# Patient Record
Sex: Female | Born: 1960 | Race: Black or African American | Hispanic: No | Marital: Single | Smoking: Never smoker
Health system: Southern US, Community
[De-identification: ages and names within clinical notes are randomized; demographics above are authoritative.]

## PROBLEM LIST (undated history)

## (undated) DIAGNOSIS — G8929 Other chronic pain: Secondary | ICD-10-CM

## (undated) DIAGNOSIS — M5136 Other intervertebral disc degeneration, lumbar region: Secondary | ICD-10-CM

## (undated) DIAGNOSIS — M51369 Other intervertebral disc degeneration, lumbar region without mention of lumbar back pain or lower extremity pain: Secondary | ICD-10-CM

## (undated) DIAGNOSIS — F419 Anxiety disorder, unspecified: Secondary | ICD-10-CM

## (undated) DIAGNOSIS — M7502 Adhesive capsulitis of left shoulder: Secondary | ICD-10-CM

## (undated) DIAGNOSIS — M546 Pain in thoracic spine: Secondary | ICD-10-CM

## (undated) DIAGNOSIS — M7062 Trochanteric bursitis, left hip: Secondary | ICD-10-CM

## (undated) DIAGNOSIS — K219 Gastro-esophageal reflux disease without esophagitis: Secondary | ICD-10-CM

## (undated) DIAGNOSIS — M25512 Pain in left shoulder: Secondary | ICD-10-CM

## (undated) DIAGNOSIS — M67814 Other specified disorders of tendon, left shoulder: Secondary | ICD-10-CM

## (undated) HISTORY — PX: TOTAL SHOULDER ARTHROPLASTY: SHX126

## (undated) HISTORY — PX: CHOLECYSTECTOMY: SHX55

## (undated) HISTORY — DX: Other specified disorders of tendon, left shoulder: M67.814

## (undated) HISTORY — DX: Trochanteric bursitis, left hip: M70.62

## (undated) HISTORY — DX: Adhesive capsulitis of left shoulder: M75.02

## (undated) HISTORY — PX: OTHER SURGICAL HISTORY: SHX169

## (undated) HISTORY — PX: TUBAL LIGATION: SHX77

## (undated) HISTORY — DX: Pain in left shoulder: M25.512

## (undated) HISTORY — DX: Other intervertebral disc degeneration, lumbar region without mention of lumbar back pain or lower extremity pain: M51.369

## (undated) HISTORY — PX: COLONOSCOPY: SHX174

## (undated) HISTORY — DX: Pain in thoracic spine: M54.6

## (undated) HISTORY — DX: Other intervertebral disc degeneration, lumbar region: M51.36

## (undated) HISTORY — PX: FOOT SURGERY: SHX648

## (undated) HISTORY — DX: Other chronic pain: G89.29

## (undated) HISTORY — PX: LUMBAR SPINE SURGERY: SHX701

---

## 2003-08-02 ENCOUNTER — Other Ambulatory Visit: Admission: RE | Admit: 2003-08-02 | Discharge: 2003-08-02 | Payer: Self-pay | Admitting: Obstetrics and Gynecology

## 2004-05-24 ENCOUNTER — Ambulatory Visit (HOSPITAL_COMMUNITY): Admission: RE | Admit: 2004-05-24 | Discharge: 2004-05-24 | Payer: Self-pay | Admitting: Obstetrics and Gynecology

## 2004-05-24 ENCOUNTER — Encounter (INDEPENDENT_AMBULATORY_CARE_PROVIDER_SITE_OTHER): Payer: Self-pay | Admitting: Specialist

## 2004-05-28 ENCOUNTER — Inpatient Hospital Stay (HOSPITAL_COMMUNITY): Admission: AD | Admit: 2004-05-28 | Discharge: 2004-05-30 | Payer: Self-pay | Admitting: Obstetrics and Gynecology

## 2009-04-14 ENCOUNTER — Ambulatory Visit: Payer: Self-pay | Admitting: Obstetrics and Gynecology

## 2009-04-15 ENCOUNTER — Ambulatory Visit (HOSPITAL_COMMUNITY): Admission: RE | Admit: 2009-04-15 | Discharge: 2009-04-15 | Payer: Self-pay | Admitting: Family Medicine

## 2009-05-05 ENCOUNTER — Ambulatory Visit: Payer: Self-pay | Admitting: Obstetrics and Gynecology

## 2010-09-22 NOTE — Discharge Summary (Signed)
NAMEJEAN, ALEJOS              ACCOUNT NO.:  [id number]   MEDICAL RECORD NO.:  [id number]          PATIENT TYPE:  INP   LOCATION:  9306                          FACILITY:  [facility]   PHYSICIAN:  Janine Limbo, M.D.DATE OF BIRTH:  07/24/1960   DATE OF ADMISSION:  05/28/2004  DATE OF DISCHARGE:  05/30/2004                                 DISCHARGE SUMMARY   DISCHARGE DIAGNOSES:  1.  Status post diagnostic laparoscopy on May 24, 2004 with laparoscopic      lysis of adhesions as well as laparoscopic pelvic biopsies, hysteroscopy      with resection of endometrial polyps, dilatation and curettage.  2.  Postoperative abdominal pain.  3.  Questionable urinary tract infection.  4.  Elevated white blood cell count with a question of pelvic infection.  5.  Hypertension.   PROCEDURES THIS ADMISSION:  None.   HISTORY OF PRESENT ILLNESS:  Ms. [name] is a 50 year old female, para 4-0-0-  4, who had outpatient surgery on May 22, 2004. The patient had a  diagnostic laparoscopy with laparoscopic pelvic biopsies and laparoscopic  lysis of adhesions.  She also had hysteroscopy with a resection of an  endometrial polyp. She also had a dilatation and curettage.  The patient has  had four cesarean sections in the past. She has also had a cholecystectomy.   FINDINGS:  Uterus that sounded to 11 cm.  The patient was noted to have  polyps within the endometrial cavity but no other pathology was noted.  On  laparoscopy, the patient was found to have normal ovaries bilaterally. 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  There were dense  adhesions between the omentum and the anterior abdominal wall.  There was no  evidence of endometriosis present.  We were able to clearly see the bowel,  the appendix, and the liver. There was no evidence of pathology there.  The  patient's pathology report showing all benign  elements.  The patient called  on May 28, 2004 complaining of worsening abdominal pain.  She reports  that her pain medication did not resolve her discomfort.  Please see her  history and physical exam for more details.  The patient reports that she  has not been able to keep food down.  She reports that the pain did radiate  to her back.   ADMISSION PHYSICAL EXAM:  VITAL SIGNS:  Temperature 98.2, pulse 76,  respirations 20, blood pressure 123/68.  GENERAL:  Significant for an uncomfortable appearing patient that was  holding her abdomen.  ABDOMEN:  Her abdomen was noted to be soft but diffusely tender to  palpation.  Her incisions were well healed and clean. There was no evidence  of infection.  PELVIC:  The cervix was tender to motion and adnexal tenderness was noted on  the right.  The left adnexa was thought to be nontender.   ADMISSION LABORATORY VALUES:  A urine culture was obtained and it later grew  out 100,000 colonies of staph aureus.  GC culture was negative, chlamydia  culture was negative. Wet prep was normal except for multiple clue cells.  White blood cell count was 11,100 (58 neutrophils, 37 lymphocytes, 5 monos  and 1 eosinophils).  Hemoglobin was 11.4, platelet count was 382,000.  Chemistries were within normal limits except for a CO2 of 34, a BUN of 4,  albumin of 2.8.  Serum pregnancy test was negative.  Urine analysis showed  30 mg per deciliter of protein but otherwise normal.  There were a few  epithelial cells and few bacteria present.  The patient was admitted to the  hospital and she was started on IV Rocephin 1 g q.8 hours.  The patient  remained afebrile throughout her hospital stay.  Her IV antibiotics were  changed to cefoxitin 2 mg IV q.6 hours.  She was also given metronidazole  500 mg p.o. b.i.d.  A CT scan of the abdomen was obtained (with contrast)  and it was read as normal.  A CT scan of the pelvis with contrast was  obtained and it was also read  as normal.  Over the next two days, the  patient reports that she did begin to feel somewhat better.  On May 29, 2004, the patient's white blood cell count was noted to be 7000.  On May 30, 2004, the patient's white blood cell count was noted to be 7500.  By  May 30, 2004, the patient did tolerate a regular diet. She did have a  bowel movement.  She reported that she was still somewhat nauseated but  admitted that she did feel better.  The decision was made to discharge the  patient to home and to continue to manage her as an outpatient.  On May 30, 2004, the patient's abdomen was soft and her bowel sounds were positive.  Her incisions were well healed.   DISCHARGE MEDICATIONS:  1.  Doxicycline 100 mg b.i.d. for seven days.  2.  Septra DS 1 tablet twice each day for three days.  3.  Phenergan 25 mg tablets, 1 tablet every 6 hours as needed for nausea.  4.  Phenergan 25 mg suppository, 1 suppository q. 6h. per rectum as needed      for nausea.  5.  Dilaudid 2 mg tablets, 1 tablet every 3-4 hours as needed for pain.   DISCHARGE INSTRUCTIONS:  The patient will monitor her temperature and she  will call for a temperature of greater than 100.4.  She will call for severe  problems or questions. She will return to see Dr. Stefano Gaul in the office on  June 01, 2004 for followup examination or on an as needed basis.      AVS/MEDQ  D:  05/30/2004  T:  05/30/2004  Job:  [id number]

## 2010-09-22 NOTE — H&P (Signed)
Renee Lane, Renee Lane              ACCOUNT NO.:  [id number]   MEDICAL RECORD NO.:  [id number]          PATIENT TYPE:  AMB   LOCATION:  SDC                           FACILITY:  [facility]   PHYSICIAN:  Janine Limbo, M.D.DATE OF BIRTH:  [birth year]   DATE OF ADMISSION:  05/24/2004  DATE OF DISCHARGE:                                HISTORY & PHYSICAL   HISTORY OF PRESENT ILLNESS:  Ms. Renee Lane is a 50 year old female, para 4-0-0-  4, who presents complaining of abdominal and pelvic pain.  The patient has  had four C sections in the past.  An ultrasound was performed that showed an  ovarian cyst. A followup ultrasound did not demonstrate ovarian cyst,  however.  The patient also complains of heavy and irregular bleeding. A  hydrosonogram was performed and the patient was found to have lesions within  the endometrial cavity consistent with polyps.  One lesion measured 1.05 cm.  The other lesion measured 0.53 cm.  The ovaries appeared normal on her most  recent ultrasound.  The uterus measured 10.7 cm x 5.4 cm.  The endometrial  thickness was 0.49 cm.  The patient's most recent Pap smear was within  normal limits.  Her GC and chlamydia cultures were negative.  The patient  reports that pain medications have not been able to keep her comfortable.   OBSTETRICAL HISTORY:  The patient has had four cesarean sections at term.  She has also had a tubal ligation.   PAST MEDICAL HISTORY:  The patient has a history of anemia as well as  stomach problems.  The patient reports that she suffers from fluid retention  as well as gastroesophageal reflux disease.  In February of 2005, the  patient reports that she hurt her back and physical therapy was required.  The patient has had surgery on her foot and she has also had a  cholecystectomy done.   CURRENT MEDICATIONS:  Hydrocodone, hydrochlorothiazide 25 mg each day and  protonix.   ALLERGIES:  PERCOCET causes anxiety reactions.   SOCIAL HISTORY:  The  patient denies cigarette use, alcohol use, and  recreational drug use.   REVIEW OF SYMPTOMS:  Please see history of present illness.   FAMILY HISTORY:  The patient's mother has a history of hypertension,  diabetes and coronary artery disease. There was a question of a cervical  cancer in the past. The patient's father died from trauma. There is a  distant family history of diabetes and heart disease.   PHYSICAL EXAMINATION:  GENERAL:  Weight 230 pounds.  HEENT:  Within normal limits.  CHEST:  Clear.  HEART:  Regular rate and rhythm.  BREASTS:  Without masses.  ABDOMEN:  Nontender and there are multiple scars present.  BACK:  Nontender.  EXTREMITIES:  Grossly normal.  NEUROLOGIC:  Grossly normal.  PELVIC:  External genitalia is normal, the vagina is normal except for  relaxation. Cervix is nontender. The uterus is upper limits of normal size  and slightly tender to exam. Adnexa slightly tender to exam and rectovaginal  exam confirms.   ASSESSMENT:  1.  Irregular bleeding.  2.  Endometrial polyps.  3.  Pelvic pain.  4.  Status post four cesarean sections.  5.  Status post cholecystectomy.   PLAN:  The patient will undergo a diagnostic laparoscopy, hysterectomy and  dilatation and curettage. She understands the indications for her procedure  and she accepts the risk of, but not limited to, anesthetic complications,  bleeding, infections and possible damage to the surrounding organs. We will  use the resection apparatus through the hysteroscope if necessary. The  patient understands that no guarantees can be given concerning the total  relief of her discomfort.     Arth   AVS/MEDQ  D:  05/22/2004  T:  05/22/2004  Job:  [id number]

## 2010-09-22 NOTE — Op Note (Signed)
NAMEBRYANT, LIPPS              ACCOUNT NO.:  [id number]   MEDICAL RECORD NO.:  [id number]          PATIENT TYPE:  AMB   LOCATION:  SDC                           FACILITY:  [facility]   PHYSICIAN:  Janine Limbo, M.D.DATE OF BIRTH:  09-27-1960   DATE OF PROCEDURE:  05/24/2004  DATE OF DISCHARGE:                                 OPERATIVE REPORT   PREOPERATIVE DIAGNOSES:  1.  Irregular uterine bleeding.  2.  Endometrial polyps.  3.  Pelvic pain.  4.  Four prior cesarean sections.  5.  Mole on left abdomen.   POSTOPERATIVE DIAGNOSIS:  1.  Irregular uterine bleeding.  2.  Endometrial polyps.  3.  Pelvic pain.  4.  Four prior cesarean sections.  5.  Mole on left abdomen.  6.  Pelvic adhesions.   PROCEDURES:  1.  Diagnostic hysteroscopy.  2.  Hysteroscopic resection of endometrial polyps.  3.  Dilatation and curettage.  4.  Diagnostic laparoscopy.  5.  Laparoscopic pelvic biopsies.  6.  Laparoscopic lysis of adhesions.   SURGEON:  Janine Limbo, M.D.   FIRST ASSISTANT:  None.   ANESTHETIC:  General.   DISPOSITION:  Ms. [name] as a 50 year old female who presents with the above-  mentioned diagnoses.  The patient understands the indications for her  surgical procedure and she accepts the risk of, but not limited to,  anesthetic complications, bleeding, infection, and possible damage to  surrounding organs.  She understands that no guarantees can be given  concerning the total relief of her pelvic discomfort.   FINDINGS:  The uterus sounded to 11 cm.  Upon inspection of the uterus, the  patient was noted to have polyps within the endometrium.  There were no  other pathologic-appearing lesions.  On laparoscopy the patient was noted to  have normal ovaries bilaterally.  There was a hydatid cyst of Morgagni  present on the right fallopian tube, and it measured approximately 1.5 cm in  size.  There was likewise a hydatid cyst of Morgagni on the left fallopian  tube.   There was a dense band of adhesions between the omentum and the  anterior abdominal wall.  The anterior cul-de-sac did not appear to have any  signs of endometriosis.  There was slight scarring from the patient's prior  cesarean deliveries.  The posterior cul-de-sac was clear.  There was no  evidence of endometriosis.  The bowel, the appendix, and the liver appeared  normal.  The gallbladder appeared to be surgically absent.  There was a  pedunculated mole on the left lower abdomen that measured approximately 2 cm  in size.  It did not appear malignant.  It was covered with skin.   PROCEDURE:  The patient was taken to the operating room, where a general  anesthetic was given.  The patient's abdomen, perineum, and vagina were  prepped with multiple layers of Betadine.  Examination under anesthesia was  then performed.  A Foley catheter was placed in the bladder.  The patient  was sterilely draped.  A paracervical block was placed using 10 mL of 0.5%  Marcaine  with epinephrine.  An endocervical curettage was performed.  The  uterus was then sounded to 11 cm. The cervix was slightly dilated.  The  diagnostic hysteroscope was inserted and the pelvic cavity was inspected  with findings as mentioned above.  Pictures were taken were taken of the  patient's hysteroscopic anatomy.  We then explored the uterus using Randall  stone forceps.  The uterus was then curetted using a medium sharp curette.  The diagnostic hysteroscope was again inserted and the patient was again  noted to have polyps within the endometrial cavity.  We then dilated the  cervix further and we inserted the operative hysteroscope.  The resection  instrument was placed through the hysteroscope and the polyps were removed  using a single loop apparatus.  Hemostasis was noted to be adequate.  Again,  pictures were taken and the cavity was noted to be clean.  All instruments  were removed.  We felt that at that point we were ready  to proceed with our  laparoscopy.  A Hulka tenaculum was placed inside the uterus.  The the  patient was tilted.  The operator then changed gown and gloves.  The  subumbilical area was injected with 5 mL of 0.5% Marcaine with epinephrine.  An incision was made and carried sharply through the subcutaneous tissue,  the fascia, and the anterior peritoneum.  The Hulka tenaculum was sutured  into place.  The laparoscope was inserted after a pneumoperitoneum was  obtained.  The pelvis was carefully inspected.  The left suprapubic region  of the abdomen was then injected with 3 mL of 0.5% Marcaine with  epinephrine.  An incision was made.  A 5 mm trocar was placed in the lower  abdomen under direct visualization.  Pictures were taken of the patient's  pelvic anatomy.  We began our procedure by removing the hydatid cyst of  Morgagni on the right.  The fluid was drained from the hydatid cyst on the  left fallopian tube.  The adhesion between the omentum and the anterior  abdominal wall was then cauterized at its attachment to the anterior  abdominal wall.  Care was taken not to damage any of the vital underlying  structures.  The adhesion was then lysed using sharp dissection.  The pelvis  and abdominal contents were then carefully inspected.  There was no evidence  of endometriosis.  There were some adhesions that were removed through the  laparoscope.  There was some material present on the left anterior uterus  that was removed through the laparoscope using sharp dissection and bipolar  cautery.  At this point we felt that we were ready to end the laparoscopic  portion of our procedure.  The suprapubic trocar was removed under direct  visualization.  The Hasson cannula was then removed.  The pneumoperitoneum  was allowed to escape.  The fascia was closed using figure-of-eight sutures  of 0 Vicryl.  The subcutaneous layer at the umbilicus was closed using 0 Vicryl.  The skin was reapproximated  using a subcuticular suture of 4-0  Vicryl.  Sponge, needle, and instrument counts were correct.  The estimated  blood loss to this point was 15 mL.  The mole on the left lower abdomen was  injected with 2 mL of 0.5% Marcaine with epinephrine.  The mole was sharply  excised.  The skin was reapproximated using a subcuticular suture of 4-0  Vicryl.  All counts were noted to be correct.  The patient was returned to  a  more normal supine position.  The anesthetic was reversed without difficulty  and the patient was taken to the recovery room in stable condition.  The  estimated sorbitol deficit for hysteroscopy was approximately 80 mL.  The  patient tolerated her procedure well.  She was noted to drain clear yellow  urine at the end of our procedure.   FOLLOW-UP INSTRUCTIONS:  The patient will return to see Dr. Stefano Gaul in two  to four weeks for follow-up examination.  She was given a prescription for  Darvocet, and she will take one or two tablets every four hours as  needed for pain.  She will call for questions or concerns.  She was given a  copy of the postoperative instruction sheet as prepared by the [facility] for patients who have undergone a dilatation and  curettage and also who have undergone laparoscopy.     Arth   AVS/MEDQ  D:  05/24/2004  T:  05/24/2004  Job:  [id number]

## 2011-06-11 ENCOUNTER — Emergency Department (HOSPITAL_BASED_OUTPATIENT_CLINIC_OR_DEPARTMENT_OTHER)
Admission: EM | Admit: 2011-06-11 | Discharge: 2011-06-11 | Discharge status: Against Medical Advice | Payer: 59 | Attending: Emergency Medicine | Admitting: Emergency Medicine

## 2011-06-11 ENCOUNTER — Encounter (HOSPITAL_BASED_OUTPATIENT_CLINIC_OR_DEPARTMENT_OTHER): Payer: Self-pay | Admitting: *Deleted

## 2011-06-11 DIAGNOSIS — M79609 Pain in unspecified limb: Secondary | ICD-10-CM | POA: Insufficient documentation

## 2011-06-11 HISTORY — DX: Gastro-esophageal reflux disease without esophagitis: K21.9

## 2011-06-11 NOTE — ED Notes (Signed)
States pain to her right great toe x 3 months. Started after getting a pedicure. Has been seen for same without relief.

## 2011-06-11 NOTE — ED Notes (Signed)
Pt and visitor playing with phone, never mentioned anything to [facility] out,signed ama.

## 2011-06-11 NOTE — ED Notes (Signed)
ama discussed with charge nurse and form signed

## 2011-11-07 ENCOUNTER — Emergency Department (HOSPITAL_BASED_OUTPATIENT_CLINIC_OR_DEPARTMENT_OTHER)
Admission: EM | Admit: 2011-11-07 | Discharge: 2011-11-07 | Disposition: A | Discharge status: Home / Self Care | Payer: 59 | Attending: Emergency Medicine | Admitting: Emergency Medicine

## 2011-11-07 ENCOUNTER — Encounter (HOSPITAL_BASED_OUTPATIENT_CLINIC_OR_DEPARTMENT_OTHER): Payer: Self-pay | Admitting: *Deleted

## 2011-11-07 DIAGNOSIS — Z79899 Other long term (current) drug therapy: Secondary | ICD-10-CM | POA: Insufficient documentation

## 2011-11-07 DIAGNOSIS — M76899 Other specified enthesopathies of unspecified lower limb, excluding foot: Secondary | ICD-10-CM | POA: Insufficient documentation

## 2011-11-07 DIAGNOSIS — K219 Gastro-esophageal reflux disease without esophagitis: Secondary | ICD-10-CM | POA: Insufficient documentation

## 2011-11-07 DIAGNOSIS — M719 Bursopathy, unspecified: Secondary | ICD-10-CM

## 2011-11-07 MED ORDER — PREDNISONE 20 MG PO TABS: 40.0000 mg | ORAL_TABLET | Freq: Every day | ORAL | Status: DC

## 2011-11-07 MED ORDER — HYDROCODONE-ACETAMINOPHEN 5-500 MG PO TABS: 1.0000 | ORAL_TABLET | Freq: Four times a day (QID) | ORAL | Status: AC | PRN

## 2011-11-07 NOTE — ED Notes (Signed)
Pt c/o left hip pain radiating into left thigh since saturday

## 2011-11-07 NOTE — ED Provider Notes (Signed)
History     CSN: [id number]  Arrival date & time 11/07/11  2036   First MD Initiated Contact with Patient 11/07/11 2101      Chief Complaint  Patient presents with  . Hip Pain    (Consider location/radiation/quality/duration/timing/severity/associated sxs/prior treatment) HPI Comments: Pt states that it hurts with palpation and certain movement to the left hip:pt states that she has not had any injury:pt states that she has recently started zumba  Patient is a 51 y.o. female presenting with hip pain. The history is provided by the patient. No language interpreter was used.  Hip Pain This is a new problem. The current episode started in the past 7 days. The problem occurs constantly. The problem has been unchanged. Pertinent negatives include no fever. The symptoms are aggravated by bending and twisting. She has tried NSAIDs for the symptoms. The treatment provided mild relief.    Past Medical History  Diagnosis Date  . GERD (gastroesophageal reflux disease)     Past Surgical History  Procedure Date  . C sections   . Foot surgery   . Cholecystectomy   . Tubal ligation     No family history on file.  History  Substance Use Topics  . Smoking status: Never Smoker   . Smokeless tobacco: Never Used  . Alcohol Use: No    OB History    Grav Para Term Preterm Abortions TAB SAB Ect Mult Living                  Review of Systems  Constitutional: Negative for fever.  Respiratory: Negative.   Cardiovascular: Negative.   Musculoskeletal:       Left thigh pain    Allergies  Percocet  Home Medications   Current Outpatient Rx  Name Route Sig Dispense Refill  . VITAMIN B-12 PO Oral Take by mouth. Patient receives this injection every two weeks on Friday.    . ESOMEPRAZOLE MAGNESIUM 20 MG PO CPDR Oral Take 20 mg by mouth daily before breakfast.    . PHENTERMINE HCL 37.5 MG PO CAPS Oral Take 37.5 mg by mouth every morning.    Marland Kitchen HYDROCODONE-ACETAMINOPHEN 5-500 MG PO TABS  Oral Take 1-2 tablets by mouth every 6 (six) hours as needed for pain. 10 tablet 0  . PREDNISONE 20 MG PO TABS Oral Take 2 tablets (40 mg total) by mouth daily. 10 tablet 0    BP 99/69  Pulse 83  Temp 98.5 F (36.9 C) (Oral)  Resp 16  Wt 199 lb (90.266 kg)  SpO2 100%  LMP 10/21/2011  Physical Exam  Nursing note and vitals reviewed. Constitutional: She is oriented to person, place, and time. She appears well-developed and well-nourished.  Cardiovascular: Normal rate and regular rhythm.   Pulmonary/Chest: Effort normal and breath sounds normal.  Musculoskeletal: Normal range of motion.       Pt has full NWG:NFAOZH to the left hip:no redness or warmth noted  Neurological: She is alert and oriented to person, place, and time.  Skin: Skin is warm and dry.  Psychiatric: She has a normal mood and affect.    ED Course  Procedures (including critical care time)  Labs Reviewed - No data to display No results found.   1. Bursitis       MDM  Pt has full YQM:VHQI treat for bursitis:no sign of septic joint        Teressa Lower, NP 11/07/11 2147

## 2011-11-08 NOTE — ED Provider Notes (Signed)
Medical screening examination/treatment/procedure(s) were performed by non-physician practitioner and as supervising physician I was immediately available for consultation/collaboration.   Gid Schoffstall, MD 11/08/11 0011 

## 2013-08-03 ENCOUNTER — Encounter: Payer: Self-pay | Admitting: Obstetrics & Gynecology

## 2013-08-03 ENCOUNTER — Ambulatory Visit (INDEPENDENT_AMBULATORY_CARE_PROVIDER_SITE_OTHER): Payer: 59 | Admitting: Obstetrics & Gynecology

## 2013-08-03 VITALS — BP 126/77 | HR 75 | Temp 98.7°F | Ht 69.0 in | Wt 213.0 lb

## 2013-08-03 DIAGNOSIS — Z0000 Encounter for general adult medical examination without abnormal findings: Secondary | ICD-10-CM

## 2013-08-03 DIAGNOSIS — Z01419 Encounter for gynecological examination (general) (routine) without abnormal findings: Secondary | ICD-10-CM

## 2013-08-03 MED ORDER — NORETHINDRONE 0.35 MG PO TABS: 1.0000 | ORAL_TABLET | Freq: Every day | ORAL | Status: DC

## 2013-08-03 NOTE — Progress Notes (Signed)
Subjective:     Renee Lane is a 53 y.o. female here for a routine exam.  Current complaints: Patient is in the office today for annual exam- patient does have pain on R when her cycle start and sometimes during intercourse..  Personal health questionnaire reviewed: no.   Gynecologic History Patient's last menstrual period was 07/07/2013. Contraception: tubal ligation Last Pap: 2013. Results were: normal Last mammogram: 07/2013. Results were: not resulted yet- but have always been normal.  Obstetric History OB History  No data available     The following portions of the patient's history were reviewed and updated as appropriate: allergies, current medications, past family history, past medical history, past social history, past surgical history and problem list.  Review of Systems Pertinent items are noted in HPI.    Objective:      General appearance: alert Breasts: normal appearance, no masses or tenderness Abdomen: soft, non-tender; bowel sounds normal; no masses,  no organomegaly Pelvic: cervix normal in appearance, external genitalia normal, no adnexal masses or tenderness, uterus normal size, shape, and consistency and vagina normal without discharge       Informal U/S by me: no adnexal masses premm [employer] model lynch risk  < 5% Assessment:    Healthy female exam.    Plan:    Return prn

## 2013-08-04 LAB — HIV ANTIBODY (ROUTINE TESTING W REFLEX): HIV: NONREACTIVE

## 2013-08-04 LAB — PAP IG AND HPV HIGH-RISK: HPV DNA High Risk: NOT DETECTED

## 2013-08-04 LAB — RPR

## 2013-08-06 NOTE — Patient Instructions (Signed)

## 2014-01-04 ENCOUNTER — Encounter: Payer: Self-pay | Admitting: Obstetrics & Gynecology

## 2014-01-04 ENCOUNTER — Ambulatory Visit (INDEPENDENT_AMBULATORY_CARE_PROVIDER_SITE_OTHER): Payer: 59 | Admitting: Obstetrics & Gynecology

## 2014-01-04 VITALS — BP 118/77 | HR 69 | Temp 98.5°F | Ht 69.0 in | Wt 210.0 lb

## 2014-01-04 DIAGNOSIS — N926 Irregular menstruation, unspecified: Secondary | ICD-10-CM

## 2014-01-04 DIAGNOSIS — N939 Abnormal uterine and vaginal bleeding, unspecified: Secondary | ICD-10-CM

## 2014-01-05 ENCOUNTER — Ambulatory Visit (HOSPITAL_COMMUNITY)
Admission: RE | Admit: 2014-01-05 | Discharge: 2014-01-05 | Disposition: A | Discharge status: Home / Self Care | Payer: 59 | Source: Ambulatory Visit | Attending: Obstetrics & Gynecology | Admitting: Obstetrics & Gynecology

## 2014-01-05 DIAGNOSIS — N938 Other specified abnormal uterine and vaginal bleeding: Secondary | ICD-10-CM | POA: Insufficient documentation

## 2014-01-05 DIAGNOSIS — N925 Other specified irregular menstruation: Secondary | ICD-10-CM | POA: Insufficient documentation

## 2014-01-05 DIAGNOSIS — N949 Unspecified condition associated with female genital organs and menstrual cycle: Secondary | ICD-10-CM | POA: Insufficient documentation

## 2014-01-05 DIAGNOSIS — N939 Abnormal uterine and vaginal bleeding, unspecified: Secondary | ICD-10-CM

## 2014-01-05 NOTE — Progress Notes (Signed)
Patient ID: Renee Lane, female   DOB: 1961-04-05, 53 y.o.   MRN: [id number]  Chief Complaint  Patient presents with  . Follow-up    HPI Renee Lane is a 53 y.o. female.  She reports continued heavy menses with the minipill.  HPI  Past Medical History  Diagnosis Date  . GERD (gastroesophageal reflux disease)     Past Surgical History  Procedure Laterality Date  . C sections    . Foot surgery    . Cholecystectomy    . Tubal ligation      Family History  Problem Relation Age of Onset  . Cancer Mother   . COPD Mother   . Diabetes Mother   . Heart disease Mother     Social History History  Substance Use Topics  . Smoking status: Never Smoker   . Smokeless tobacco: Never Used  . Alcohol Use: No    Allergies  Allergen Reactions  . Percocet [Oxycodone-Acetaminophen] Rash    Current Outpatient Prescriptions  Medication Sig Dispense Refill  . aspirin 81 MG tablet Take 81 mg by mouth daily.      . Cyanocobalamin (VITAMIN B-12 PO) Take by mouth. Patient receives this injection every two weeks on Friday.      . Diethylpropion HCl CR 75 MG TB24 Take 1 tablet by mouth daily.      Marland Kitchen esomeprazole (NEXIUM) 20 MG capsule Take 20 mg by mouth daily before breakfast.      . norethindrone (MICRONOR,CAMILA,ERRIN) 0.35 MG tablet Take 1 tablet (0.35 mg total) by mouth daily.  1 Package  11   No current facility-administered medications for this visit.    Review of Systems Review of Systems Constitutional: negative for fatigue and weight loss Respiratory: negative for cough and wheezing Cardiovascular: negative for chest pain, fatigue and palpitations Gastrointestinal: negative for abdominal pain and change in bowel habits Genitourinary: positive for abnormal uterine bleeding Integument/breast: negative for nipple discharge Musculoskeletal:negative for myalgias Neurological: negative for gait problems and tremors Behavioral/Psych: negative for abusive relationship,  depression Endocrine: negative for temperature intolerance     Blood pressure 118/77, pulse 69, temperature 98.5 F (36.9 C), height  (1.753 m), weight 95.255 kg (210 lb), last menstrual period 12/05/2013.  Physical Exam Physical Exam   50% of 15 min visit spent on counseling and coordination of care.   Data Reviewed None  Assessment    AUB--?etiology, consider E    Plan    Orders Placed This Encounter  Procedures  . US Transvaginal Non-OB    Standing Status: Future     Number of Occurrences:      Standing Expiration Date: 03/07/2015    Scheduling Instructions:     Scheduled within the next 3 weeks    Order Specific Question:  Reason for Exam (SYMPTOM  OR DIAGNOSIS REQUIRED)    Answer:  Abnormal uterine bleeding    Order Specific Question:  Preferred imaging location?    Answer:  [facility]  . US Pelvis Complete    Standing Status: Future     Number of Occurrences:      Standing Expiration Date: 03/07/2015    Order Specific Question:  Reason for Exam (SYMPTOM  OR DIAGNOSIS REQUIRED)    Answer:  Abnormal bleeding    Order Specific Question:  Preferred imaging location?    Answer:  [facility]   Meds ordered this encounter  Medications  . Diethylpropion HCl CR 75 MG TB24    Sig: Take 1  tablet by mouth daily.   She elects to proceed with a D&C/hysteroscopy/Novasure endometrial ablation.  The risks, benefits and alternative forms of management were discussed. Follow up as needed.         JACKSON-MOORE,Shanah Guimaraes A 01/05/2014, 3:49 AM

## 2014-01-05 NOTE — Patient Instructions (Signed)
Endometrial Ablation Endometrial ablation removes the lining of the uterus (endometrium). It is usually a same-day, outpatient treatment. Ablation helps avoid major surgery, such as surgery to remove the cervix and uterus (hysterectomy). After endometrial ablation, you will have little or no menstrual bleeding and may not be able to have children. However, if you are premenopausal, you will need to use a reliable method of birth control following the procedure because of the small chance that pregnancy can occur. There are different reasons to have this procedure, which include:  Heavy periods.  Bleeding that is causing anemia.  Irregular bleeding.  Bleeding fibroids on the lining inside the uterus if they are smaller than 3 centimeters. This procedure should not be done if:  You want children in the future.  You have severe cramps with your menstrual period.  You have precancerous or cancerous cells in your uterus.  You were recently pregnant.  You have gone through menopause.  You have had major surgery on the uterus, such as a cesarean delivery. LET YOUR HEALTH CARE PROVIDER KNOW ABOUT:  Any allergies you have.  All medicines you are taking, including vitamins, herbs, eye drops, creams, and over-the-counter medicines.  Previous problems you or members of your family have had with the use of anesthetics.  Any blood disorders you have.  Previous surgeries you have had.  Medical conditions you have. RISKS AND COMPLICATIONS  Generally, this is a safe procedure. However, as with any procedure, complications can occur. Possible complications include:  Perforation of the uterus.  Bleeding.  Infection of the uterus, bladder, or vagina.  Injury to surrounding organs.  An air bubble to the lung (air embolus).  Pregnancy following the procedure.  Failure of the procedure to help the problem, requiring hysterectomy.  Decreased ability to diagnose cancer in the lining of  the uterus. BEFORE THE PROCEDURE  The lining of the uterus must be tested to make sure there is no pre-cancerous or cancer cells present.  An ultrasound may be performed to look at the size of the uterus and to check for abnormalities.  Medicines may be given to thin the lining of the uterus. PROCEDURE  During the procedure, your health care provider will use a tool called a resectoscope to help see inside your uterus. There are different ways to remove the lining of your uterus.   Radiofrequency - This method uses a radiofrequency-alternating electric current to remove the lining of the uterus.  Cryotherapy - This method uses extreme cold to freeze the lining of the uterus.  Heated-Free Liquid - This method uses heated salt (saline) solution to remove the lining of the uterus.  Microwave - This method uses high-energy microwaves to heat up the lining of the uterus to remove it.  Thermal balloon - This method involves inserting a catheter with a balloon tip into the uterus. The balloon tip is filled with heated fluid to remove the lining of the uterus. AFTER THE PROCEDURE  After your procedure, do not have sexual intercourse or insert anything into your vagina until permitted by your health care provider. After the procedure, you may experience:  Cramps.  Vaginal discharge.  Frequent urination. Document Released: 03/02/2004 Document Revised: 12/24/2012 Document Reviewed: 09/24/2012 ExitCare Patient Information 2015 [employer]. This information is not intended to replace advice given to you by your health care provider. Make sure you discuss any questions you have with your health care provider.  

## 2014-01-18 ENCOUNTER — Other Ambulatory Visit: Payer: Self-pay | Admitting: *Deleted

## 2014-01-26 ENCOUNTER — Encounter (HOSPITAL_COMMUNITY): Payer: Self-pay

## 2014-02-01 NOTE — Patient Instructions (Addendum)
Your procedure is scheduled on:  Friday, OCT. 2, 2015  Enter through the Main Entrance of [facility] at:  12:30 P.M.  Pick up the phone at the desk and dial [phone].  Call this number if you have problems the morning of surgery: [phone].  Remember: Do NOT eat food: AFTER MIDNIGHT Thursday, February 04, 2014 Do NOT drink clear liquids after:  After 10:00 am day of surgery Take these medicines the morning of surgery with a SIP OF WATER: OMEPRAZOLE STOP TAKING VITAMINS, DIETHYLPROPION  Do NOT wear jewelry (body piercing), metal hair clips/bobby pins, make-up, or nail polish. Do NOT wear lotions, powders, or perfumes.  You may wear deoderant. Do NOT shave for 48 hours prior to surgery. Do NOT bring valuables to the hospital. Contacts, dentures, or bridgework may not be worn into surgery.  Have a responsible adult drive you home and stay with you for 24 hours after your procedure

## 2014-02-02 ENCOUNTER — Encounter (HOSPITAL_COMMUNITY): Payer: Self-pay

## 2014-02-02 ENCOUNTER — Inpatient Hospital Stay (HOSPITAL_COMMUNITY)
Admission: RE | Admit: 2014-02-02 | Discharge: 2014-02-02 | Disposition: A | Discharge status: Home / Self Care | Payer: 59 | Source: Ambulatory Visit

## 2014-02-02 ENCOUNTER — Encounter (HOSPITAL_COMMUNITY)
Admission: RE | Admit: 2014-02-02 | Discharge: 2014-02-02 | Disposition: A | Discharge status: Home / Self Care | Payer: 59 | Source: Ambulatory Visit | Attending: Obstetrics & Gynecology | Admitting: Obstetrics & Gynecology

## 2014-02-02 DIAGNOSIS — N938 Other specified abnormal uterine and vaginal bleeding: Secondary | ICD-10-CM | POA: Diagnosis present

## 2014-02-02 DIAGNOSIS — K219 Gastro-esophageal reflux disease without esophagitis: Secondary | ICD-10-CM | POA: Diagnosis not present

## 2014-02-02 DIAGNOSIS — N882 Stricture and stenosis of cervix uteri: Secondary | ICD-10-CM | POA: Diagnosis not present

## 2014-02-02 DIAGNOSIS — M329 Systemic lupus erythematosus, unspecified: Secondary | ICD-10-CM | POA: Diagnosis not present

## 2014-02-02 LAB — CBC
HCT: 36.4 % (ref 36.0–46.0)
Hemoglobin: 11.8 g/dL — ABNORMAL LOW (ref 12.0–15.0)
MCH: 27.8 pg (ref 26.0–34.0)
MCHC: 32.4 g/dL (ref 30.0–36.0)
MCV: 85.6 fL (ref 78.0–100.0)
Platelets: 300 10*3/uL (ref 150–400)
RBC: 4.25 MIL/uL (ref 3.87–5.11)
RDW: 13.9 % (ref 11.5–15.5)
WBC: 8.8 10*3/uL (ref 4.0–10.5)

## 2014-02-02 LAB — SURGICAL PCR SCREEN
MRSA, PCR: NEGATIVE
Staphylococcus aureus: NEGATIVE

## 2014-02-02 NOTE — Patient Instructions (Addendum)
Your procedure is scheduled on:  Friday, February 05, 2014  [facility] at:  12:30pm  Pick up the phone at the desk and dial [phone].  Call this number if you have problems the morning of surgery: [phone].  Remember: Do NOT eat food: After midnight Thursday Do NOT drink clear liquids after:  After 10:00 am day of surgery Take these medicines the morning of surgery with a SIP OF WATER: Omeprazole  Do NOT wear jewelry (body piercing), metal hair clips/bobby pins, make-up, or nail polish. Do NOT wear lotions, powders, or perfumes.  You may wear deoderant. Do NOT shave for 48 hours prior to surgery. Do NOT bring valuables to the hospital. Contacts, dentures, or bridgework may not be worn into surgery.  Have a responsible adult drive you home and stay with you for 24 hours after your procedure

## 2014-02-03 ENCOUNTER — Encounter: Payer: Self-pay | Admitting: *Deleted

## 2014-02-05 ENCOUNTER — Encounter (HOSPITAL_COMMUNITY)
Admission: RE | Disposition: A | Discharge status: Home / Self Care | Payer: Self-pay | Source: Ambulatory Visit | Attending: Obstetrics & Gynecology

## 2014-02-05 ENCOUNTER — Ambulatory Visit (HOSPITAL_COMMUNITY): Payer: 59

## 2014-02-05 ENCOUNTER — Encounter (HOSPITAL_COMMUNITY): Payer: 59 | Admitting: Certified Registered Nurse Anesthetist

## 2014-02-05 ENCOUNTER — Ambulatory Visit (HOSPITAL_COMMUNITY)
Admission: RE | Admit: 2014-02-05 | Discharge: 2014-02-05 | Disposition: A | Discharge status: Home / Self Care | Payer: 59 | Source: Ambulatory Visit | Attending: Obstetrics & Gynecology | Admitting: Obstetrics & Gynecology

## 2014-02-05 ENCOUNTER — Encounter (HOSPITAL_COMMUNITY): Payer: Self-pay | Admitting: Registered Nurse

## 2014-02-05 ENCOUNTER — Ambulatory Visit (HOSPITAL_COMMUNITY): Payer: 59 | Admitting: Certified Registered Nurse Anesthetist

## 2014-02-05 DIAGNOSIS — N939 Abnormal uterine and vaginal bleeding, unspecified: Secondary | ICD-10-CM

## 2014-02-05 DIAGNOSIS — N882 Stricture and stenosis of cervix uteri: Secondary | ICD-10-CM

## 2014-02-05 DIAGNOSIS — N938 Other specified abnormal uterine and vaginal bleeding: Secondary | ICD-10-CM | POA: Diagnosis not present

## 2014-02-05 HISTORY — PX: DILITATION & CURRETTAGE/HYSTROSCOPY WITH NOVASURE ABLATION: SHX5568

## 2014-02-05 SURGERY — DILATATION & CURETTAGE/HYSTEROSCOPY WITH NOVASURE ABLATION
Anesthesia: General

## 2014-02-05 MED ORDER — DEXAMETHASONE SODIUM PHOSPHATE 4 MG/ML IJ SOLN
INTRAMUSCULAR | Status: AC
Start: 2014-02-05 — End: 2014-02-05
  Filled 2014-02-05: qty 1

## 2014-02-05 MED ORDER — LIDOCAINE HCL 1 % IJ SOLN
INTRAMUSCULAR | Status: AC
  Filled 2014-02-05: qty 20

## 2014-02-05 MED ORDER — LIDOCAINE HCL 1 % IJ SOLN
INTRAMUSCULAR | Status: DC | PRN
  Administered 2014-02-05: 10 mL

## 2014-02-05 MED ORDER — PROMETHAZINE HCL 25 MG/ML IJ SOLN
6.2500 mg | INTRAMUSCULAR | Status: AC | PRN
  Administered 2014-02-05: 12.5 mg via INTRAVENOUS
  Administered 2014-02-05: 6.25 mg via INTRAVENOUS

## 2014-02-05 MED ORDER — PROPOFOL 10 MG/ML IV BOLUS
INTRAVENOUS | Status: DC | PRN
  Administered 2014-02-05: 160 mg via INTRAVENOUS

## 2014-02-05 MED ORDER — ONDANSETRON HCL 4 MG/2ML IJ SOLN
INTRAMUSCULAR | Status: AC
Start: 2014-02-05 — End: 2014-02-05
  Filled 2014-02-05: qty 2

## 2014-02-05 MED ORDER — LIDOCAINE HCL (CARDIAC) 20 MG/ML IV SOLN
INTRAVENOUS | Status: DC | PRN
  Administered 2014-02-05: 80 mg via INTRAVENOUS

## 2014-02-05 MED ORDER — ONDANSETRON HCL 4 MG/2ML IJ SOLN
INTRAMUSCULAR | Status: AC
  Filled 2014-02-05: qty 2

## 2014-02-05 MED ORDER — PROPOFOL 10 MG/ML IV EMUL
INTRAVENOUS | Status: AC
  Filled 2014-02-05: qty 20

## 2014-02-05 MED ORDER — FENTANYL CITRATE 0.05 MG/ML IJ SOLN
INTRAMUSCULAR | Status: AC
  Filled 2014-02-05: qty 2

## 2014-02-05 MED ORDER — SCOPOLAMINE 1 MG/3DAYS TD PT72
1.0000 | MEDICATED_PATCH | Freq: Once | TRANSDERMAL | Status: DC
  Administered 2014-02-05: 1.5 mg via TRANSDERMAL

## 2014-02-05 MED ORDER — KETOROLAC TROMETHAMINE 30 MG/ML IJ SOLN
INTRAMUSCULAR | Status: DC | PRN
  Administered 2014-02-05: 30 mg via INTRAVENOUS

## 2014-02-05 MED ORDER — LACTATED RINGERS IV SOLN
INTRAVENOUS | Status: DC
  Administered 2014-02-05 (×2): via INTRAVENOUS

## 2014-02-05 MED ORDER — FENTANYL CITRATE 0.05 MG/ML IJ SOLN
INTRAMUSCULAR | Status: AC
  Administered 2014-02-05: 50 ug via INTRAVENOUS
  Filled 2014-02-05: qty 2

## 2014-02-05 MED ORDER — ACETAMINOPHEN 160 MG/5ML PO SOLN
950.0000 mg | Freq: Four times a day (QID) | ORAL | Status: DC | PRN
  Administered 2014-02-05: 950 mg via ORAL

## 2014-02-05 MED ORDER — PROMETHAZINE HCL 25 MG/ML IJ SOLN
INTRAMUSCULAR | Status: DC
Start: 2014-02-05 — End: 2014-02-05
  Filled 2014-02-05: qty 1

## 2014-02-05 MED ORDER — DEXAMETHASONE SODIUM PHOSPHATE 10 MG/ML IJ SOLN
INTRAMUSCULAR | Status: DC | PRN
  Administered 2014-02-05: 4 mg via INTRAVENOUS

## 2014-02-05 MED ORDER — ACETAMINOPHEN 160 MG/5ML PO SOLN
ORAL | Status: AC
  Filled 2014-02-05: qty 40.6

## 2014-02-05 MED ORDER — LIDOCAINE HCL (CARDIAC) 20 MG/ML IV SOLN
INTRAVENOUS | Status: AC
  Filled 2014-02-05: qty 5

## 2014-02-05 MED ORDER — TRAMADOL HCL 50 MG PO TABS: 50.0000 mg | ORAL_TABLET | Freq: Four times a day (QID) | ORAL | Status: DC | PRN

## 2014-02-05 MED ORDER — MIDAZOLAM HCL 2 MG/2ML IJ SOLN
INTRAMUSCULAR | Status: AC
Start: 2014-02-05 — End: 2014-02-05
  Filled 2014-02-05: qty 2

## 2014-02-05 MED ORDER — MIDAZOLAM HCL 2 MG/2ML IJ SOLN
INTRAMUSCULAR | Status: AC
  Filled 2014-02-05: qty 2

## 2014-02-05 MED ORDER — FENTANYL CITRATE 0.05 MG/ML IJ SOLN
INTRAMUSCULAR | Status: DC | PRN
  Administered 2014-02-05 (×2): 25 ug via INTRAVENOUS
  Administered 2014-02-05 (×2): 50 ug via INTRAVENOUS

## 2014-02-05 MED ORDER — PROPOFOL 10 MG/ML IV EMUL
INTRAVENOUS | Status: AC
Start: 2014-02-05 — End: 2014-02-05
  Filled 2014-02-05: qty 20

## 2014-02-05 MED ORDER — PROMETHAZINE HCL 25 MG/ML IJ SOLN
INTRAMUSCULAR | Status: AC
  Filled 2014-02-05: qty 1

## 2014-02-05 MED ORDER — FENTANYL CITRATE 0.05 MG/ML IJ SOLN
25.0000 ug | INTRAMUSCULAR | Status: DC | PRN
  Administered 2014-02-05 (×2): 50 ug via INTRAVENOUS

## 2014-02-05 MED ORDER — SODIUM CHLORIDE 0.9 % IJ SOLN
INTRAMUSCULAR | Status: AC
  Filled 2014-02-05: qty 3

## 2014-02-05 MED ORDER — SCOPOLAMINE 1 MG/3DAYS TD PT72
MEDICATED_PATCH | TRANSDERMAL | Status: AC
  Filled 2014-02-05: qty 1

## 2014-02-05 MED ORDER — MIDAZOLAM HCL 2 MG/2ML IJ SOLN
INTRAMUSCULAR | Status: DC | PRN
  Administered 2014-02-05: 2 mg via INTRAVENOUS

## 2014-02-05 MED ORDER — KETOROLAC TROMETHAMINE 30 MG/ML IJ SOLN
INTRAMUSCULAR | Status: AC
  Filled 2014-02-05: qty 1

## 2014-02-05 MED ORDER — ONDANSETRON HCL 4 MG/2ML IJ SOLN
INTRAMUSCULAR | Status: DC | PRN
  Administered 2014-02-05: 4 mg via INTRAVENOUS

## 2014-02-05 MED ORDER — LACTATED RINGERS IR SOLN
Status: DC | PRN
  Administered 2014-02-05: 3000 mL

## 2014-02-05 MED ORDER — DEXAMETHASONE SODIUM PHOSPHATE 10 MG/ML IJ SOLN
INTRAMUSCULAR | Status: AC
Start: 2014-02-05 — End: 2014-02-05
  Filled 2014-02-05: qty 1

## 2014-02-05 SURGICAL SUPPLY — 20 items
ABLATOR ENDOMETRIAL BIPOLAR (ABLATOR) ×5 IMPLANT
CANISTER SUCT 3000ML (MISCELLANEOUS) ×3 IMPLANT
CATH ROBINSON RED A/P 16FR (CATHETERS) ×3 IMPLANT
CLOTH BEACON ORANGE TIMEOUT ST (SAFETY) ×3 IMPLANT
CONTAINER PREFILL 10% NBF 60ML (FORM) ×6 IMPLANT
DRAPE HYSTEROSCOPY (DRAPE) ×3 IMPLANT
GAS MED CARBON DIOXIDE NOVASUR (MEDICAL GASES) ×2 IMPLANT
GLOVE BIO SURGEON STRL SZ 6.5 (GLOVE) ×2 IMPLANT
GLOVE BIO SURGEONS STRL SZ 6.5 (GLOVE) ×1
GOWN STRL REUS W/TWL LRG LVL3 (GOWN DISPOSABLE) ×6 IMPLANT
NDL SPNL 20GX3.5 QUINCKE YW (NEEDLE) IMPLANT
NEEDLE SPNL 20GX3.5 QUINCKE YW (NEEDLE) ×3 IMPLANT
PACK VAGINAL MINOR WOMEN LF (CUSTOM PROCEDURE TRAY) ×3 IMPLANT
PAD OB MATERNITY 4.3X12.25 (PERSONAL CARE ITEMS) ×3 IMPLANT
SCRUB PCMX 4 OZ (MISCELLANEOUS) ×3 IMPLANT
SET GENESYS HTA PROCERVA (MISCELLANEOUS) ×2 IMPLANT
SET TUBING HYSTEROSCOPY 2 NDL (TUBING) ×2 IMPLANT
TOWEL OR 17X24 6PK STRL BLUE (TOWEL DISPOSABLE) ×6 IMPLANT
TUBE HYSTEROSCOPY W Y-CONNECT (TUBING) ×2 IMPLANT
WATER STERILE IRR 1000ML POUR (IV SOLUTION) ×3 IMPLANT

## 2014-02-05 NOTE — Progress Notes (Signed)
[facility]

## 2014-02-05 NOTE — Transfer of Care (Signed)
Immediate Anesthesia Transfer of Care Note  Patient: Renee Lane  Procedure(s) Performed: Procedure(s): DILATATION & CURETTAGE/HYSTEROSCOPY WITH NOVASURE ABLATION (N/A)  Patient Location: PACU  Anesthesia Type:General  Level of Consciousness: awake  Airway & Oxygen Therapy: Patient Spontanous Breathing  Post-op Assessment: Report given to PACU RN  Post vital signs: stable  Filed Vitals:   02/05/14 1226  BP: 126/98  Pulse: 63  Temp: 36.8 C  Resp: 18    Complications: No apparent anesthesia complications

## 2014-02-05 NOTE — Op Note (Signed)
Preoperative diagnosis: Abnormal uterine bleeding  Postoperative diagnosis: Same  Procedure: Diagnostic hysteroscopy, dilatation and curettage, attempted endometrial ablation with ultrasound guidance  Surgeon: Antionette CharJACKSON-MOORE,Victorine Mcnee A  Anesthesia: Laryngeal mask airway, paracervical block  Estimated blood loss: less than 100 ml  Urine output: 200 ml  IV Fluids: per Anesthesiology  Complications: None  Specimen: PATHOLOGY  Operative Findings: The endometrium appeared lush.  The tubal ostia were not seen.  The internal cervical os was stenotic.  Description of procedure:   The patient was taken to the operating room and placed on the operating table in the semi-lithotomy position in [place] stirrups.  Examination under anesthesia was performed.  The patient was prepped and draped in the usual manner.  After a time-out had been completed, a speculum was placed in the vagina.  The anterior lip of the cervix was grasped with a single-toothed tenaculum.    10 cc of 1% lidocaine were injected at 4 and 7 o'clock to produce a paracervical block.  The uterine cavity sounded to 9 cm.  A Hegar dilator was inserted to measure the cervical length at 3.5 cm.  The endocervical canal was dilated with Shawnie PonsPratt dilators.  A 5 mm diagnostic hysteroscope with Glycine as the distending medium was used to perform a diagnostic hysteroscopy.  The findings are noted above.    The hysteroscope was removed.  The remainder of the procedure was performed with ultrasound guidance.  The bladder was filled in a retrograde fashion to optimize visualization.  A small, Sims curette was used to perform an endometrial curettage. The Novasure device was advanced to the fundus however the intercornual measurement was borderline at 2.5 cm.  The decision was made to abort this procedure.  Attempts were made to insert the HTA device/hysteroscope.  However the internal os could not be dilated to accommodate the device.  All the instruments were  removed from the vagina.  The bladder was emptied.  Final instrument counts were correct.  The patient was taken to the PACU in stable condition.

## 2014-02-05 NOTE — H&P (Signed)
  Chief Complaint: 53 y.o.who presents for Lane D&C/hysteroscopy/Novasure endometrial ablation  Details of Present Illness: The patient has recently noted heavier menses.  This has been refractory to attempts to medically manage with Lane minipill.  Lane pelvic U/S was normal.  There were no vitals taken for this visit.  Past Medical History  Diagnosis Date  . GERD (gastroesophageal reflux disease)   . Lupus    History   Social History  . Marital Status: Divorced    Spouse Name: N/Lane    Number of Children: N/Lane  . Years of Education: N/Lane   Occupational History  . Not on file.   Social History Main Topics  . Smoking status: Never Smoker   . Smokeless tobacco: Never Used  . Alcohol Use: No  . Drug Use: No  . Sexual Activity: Yes    Birth Control/ Protection: Surgical   Other Topics Concern  . Not on file   Social History Narrative  . No narrative on file   Family History  Problem Relation Age of Onset  . Cancer Mother   . COPD Mother   . Diabetes Mother   . Heart disease Mother     Pertinent items are noted in HPI.  Pre-Op Diagnosis: Abnormal Uterine Bleeding, perimenopausal  Planned Procedure: Procedure(s): DILATATION & CURETTAGE/HYSTEROSCOPY WITH NOVASURE ABLATION  I have reviewed the patient's history and have completed the physical exam and Renee Lane is acceptable for surgery. Alternative management options including Lane Mirena IUD was reviewed with the patient. Renee Lane,Renee Baumgarten A, MD 02/05/2014 9:29 AM

## 2014-02-05 NOTE — Anesthesia Postprocedure Evaluation (Signed)
  Anesthesia Post-op Note  Patient: Renee Lane  Procedure(s) Performed: Procedure(s): DILATATION & CURETTAGE/HYSTEROSCOPY WITH NOVASURE ABLATION (N/A)  Patient Location: PACU  Anesthesia Type:General  Level of Consciousness: awake, alert  and oriented  Airway and Oxygen Therapy: Patient Spontanous Breathing  Post-op Pain: none  Post-op Assessment: Post-op Vital signs reviewed, Patient's Cardiovascular Status Stable, Respiratory Function Stable, Patent Airway, No signs of Nausea or vomiting and Pain level controlled  Post-op Vital Signs: Reviewed and stable  Last Vitals:  Filed Vitals:   02/05/14 1815  BP: 116/49  Pulse: 65  Temp: 36.8 C  Resp: 11    Complications: No apparent anesthesia complications

## 2014-02-05 NOTE — Anesthesia Preprocedure Evaluation (Signed)
Anesthesia Evaluation  Patient identified by MRN, date of birth, ID band Patient awake    Reviewed: Allergy & Precautions, H&P , Patient's Chart, lab work & pertinent test results, reviewed documented beta blocker date and time   Airway Mallampati: II TM Distance: >3 FB Neck ROM: full    Dental no notable dental hx.    Pulmonary  breath sounds clear to auscultation  Pulmonary exam normal       Cardiovascular Rhythm:regular Rate:Normal     Neuro/Psych    GI/Hepatic GERD-  Medicated,  Endo/Other    Renal/GU      Musculoskeletal   Abdominal   Peds  Hematology   Anesthesia Other Findings GERD (gastroesophageal reflux disease)      Lupus     No meds. Few SX      Reproductive/Obstetrics                           Anesthesia Physical Anesthesia Plan  ASA: II  Anesthesia Plan:    Post-op Pain Management:    Induction: Intravenous  Airway Management Planned: LMA  Additional Equipment:   Intra-op Plan:   Post-operative Plan:   Informed Consent: I have reviewed the patients History and Physical, chart, labs and discussed the procedure including the risks, benefits and alternatives for the proposed anesthesia with the patient or authorized representative who has indicated his/her understanding and acceptance.   Dental Advisory Given and Dental advisory given  Plan Discussed with: CRNA and Surgeon  Anesthesia Plan Comments: (Discussed GA with LMA, possible sore throat, potential need to switch to ETT, N/V, pulmonary aspiration. Questions answered. )        Anesthesia Quick Evaluation

## 2014-02-05 NOTE — Discharge Instructions (Signed)
D&C Care After Read the instructions below. Refer to this sheet in the next few weeks. These instructions provide you with general information on caring for yourself after you leave the hospital. Your caregiver may also give you specific instructions.  D&C is a minor operation. A D&C involves the stretching (dilatation) of the cervix and scraping (curettage) of the inside lining of the uterus. You may have light cramping and bleeding for a couple of days to two weeks after the procedure. This procedure may be done in a hospital, outpatient clinic, or doctor's office. You may be given a drug to make you sleep (general anesthetic) or a drug that numbs the area (local anesthetic) in and around the cervix. HOME CARE INSTRUCTIONS  Do not drive for 24 hours.   Wait one week before returning to strenuous activities.   Take your temperature two times a day for 4 days and write it down. Provide these temperatures to your caregiver if they are abnormal (above 98.6 F or 37.0 C).   Avoid long periods of standing, and do no heavy lifting (more than 10 pounds), pushing or pulling.   Limit stair climbing to once or twice a day.   Take rest periods often.   You may resume your usual diet.   Drink plenty of fluids (6-8 glasses a day).   You should return to your usual bowel function. If constipation should occur, you may:   Take a mild laxative with permission from your caregiver.   Add fruit and bran to your diet.   Drink more fluids. This helps with constipation.   Take showers instead of baths until your caregiver gives you permission to take baths.   Do not go swimming or use a hot tub until your caregiver gives you permission.   Try to have someone with you or available for you the first 24 to 48 hours, especially if you had a general anesthetic.   Do not douche, use tampons, or have intercourse until after your follow-up appointment, or when your caregiver approves.   Only take  over-the-counter or prescription medicines for pain, discomfort, or fever as directed by your caregiver. Do not take aspirin. It can cause bleeding.   If a prescription was given, follow your caregiver's directions. You may be given a medicine that kills germs (antibiotic) to prevent an infection.   Keep all your follow-up appointments recommended by your caregiver.  SEEK MEDICAL CARE IF:  You have increasing cramps or pain not relieved with medication.   You develop belly (abdominal) pain which does not seem to be related to the same area of earlier cramping and pain.   You feel dizzy or feel like fainting.   You have bad smelling vaginal discharge.   You develop a rash.   You develop a reaction or allergy to your medication.  SEEK IMMEDIATE MEDICAL CARE IF:  Bleeding is heavier than a normal menstrual period.   You have an oral temperature above 100.6, not controlled by medicine.   You develop chest pain.   You develop shortness of breath.   You pass out.   You develop pain in your shoulder strap area.   You develop heavy vaginal bleeding with or without blood clots.  MAKE SURE YOU:   Understand these instructions.   Will watch your condition.   Will get help right away if you are not doing well or get worse.  UPDATED HEALTH PRACTICES  A PAP smear is done to screen for cervical  cancer.   The first PAP smear should be done at age 52.   Between ages 6021 and 429, PAP smears are repeated every 2 years.   Beginning at age 430, you are advised to have a PAP smear every 3 years as long as your past 3 PAP smears have been normal.   Some women have medical problems that increase the chance of getting cervical cancer. Talk to your caregiver about these problems. It is especially important to talk to your caregiver if a new problem develops soon after your last PAP smear. In these cases, your caregiver may recommend more frequent screening and Pap smears.   The above  recommendations are the same for women who have or have not gotten the vaccine for HPV (Human Papillomavirus).   If you had a hysterectomy for a problem that was not a cancer or a condition that could lead to cancer, then you no longer need Pap smears.   If you are between ages 2965 and 7970, and you have had normal Pap smears going back 10 years, you no longer need Pap smears.   If you have had past treatment for cervical cancer or a condition that could lead to cancer, you need Pap smears and screening for cancer for at least 20 years after your treatment.   Continue monthly self-breast examinations. Your caregiver can provide information and instructions for self-breast examination.  Document Released: 04/20/2000 Document Re-Released: 10/11/2009 [facility] Patient Information 2011 [employer], [place].

## 2014-02-08 ENCOUNTER — Encounter (HOSPITAL_COMMUNITY): Payer: Self-pay | Admitting: Obstetrics & Gynecology

## 2014-02-08 ENCOUNTER — Telehealth: Payer: Self-pay | Admitting: *Deleted

## 2014-02-08 NOTE — Telephone Encounter (Signed)
Patient states she had a surgery on Friday and states she had some increased pain. Patient states she has been using the heating pad and pain medication. Patient states it is helping. Still having some lower back pain and lower abdominal cramping. Eased with rest. Encouraged patient to continue comfort measures and to try Ibuprofen.

## 2014-02-17 ENCOUNTER — Telehealth: Payer: Self-pay | Admitting: Obstetrics & Gynecology

## 2014-02-17 NOTE — Telephone Encounter (Signed)
10.14.2015 - Patient called and scheduled appt for 10.26.2015.brm

## 2014-03-01 ENCOUNTER — Ambulatory Visit (INDEPENDENT_AMBULATORY_CARE_PROVIDER_SITE_OTHER): Payer: 59 | Admitting: Obstetrics & Gynecology

## 2014-03-01 ENCOUNTER — Encounter: Payer: Self-pay | Admitting: Obstetrics & Gynecology

## 2014-03-01 ENCOUNTER — Encounter: Payer: 59 | Admitting: Obstetrics & Gynecology

## 2014-03-01 VITALS — BP 120/83 | HR 74 | Temp 98.7°F | Ht 67.5 in | Wt 212.0 lb

## 2014-03-01 DIAGNOSIS — N882 Stricture and stenosis of cervix uteri: Secondary | ICD-10-CM

## 2014-03-01 MED ORDER — MISOPROSTOL 200 MCG PO TABS: ORAL_TABLET | ORAL | Status: DC

## 2014-03-02 NOTE — Patient Instructions (Signed)
Levonorgestrel intrauterine device (IUD) What is this medicine? LEVONORGESTREL IUD (LEE voe nor jes trel) is a contraceptive (birth control) device. The device is placed inside the uterus by a healthcare professional. It is used to prevent pregnancy and can also be used to treat heavy bleeding that occurs during your period. Depending on the device, it can be used for 3 to 5 years. This medicine may be used for other purposes; ask your health care provider or pharmacist if you have questions. COMMON BRAND NAME(S): LILETTA, Mirena, Skyla What should I tell my health care provider before I take this medicine? They need to know if you have any of these conditions: -abnormal Pap smear -cancer of the breast, uterus, or cervix -diabetes -endometritis -genital or pelvic infection now or in the past -have more than one sexual partner or your partner has more than one partner -heart disease -history of an ectopic or tubal pregnancy -immune system problems -IUD in place -liver disease or tumor -problems with blood clots or take blood-thinners -use intravenous drugs -uterus of unusual shape -vaginal bleeding that has not been explained -an unusual or allergic reaction to levonorgestrel, other hormones, silicone, or polyethylene, medicines, foods, dyes, or preservatives -pregnant or trying to get pregnant -breast-feeding How should I use this medicine? This device is placed inside the uterus by a health care professional. Talk to your pediatrician regarding the use of this medicine in children. Special care may be needed. Overdosage: If you think you have taken too much of this medicine contact a poison control center or emergency room at once. NOTE: This medicine is only for you. Do not share this medicine with others. What if I miss a dose? This does not apply. What may interact with this medicine? Do not take this medicine with any of the following  medications: -amprenavir -bosentan -fosamprenavir This medicine may also interact with the following medications: -aprepitant -barbiturate medicines for inducing sleep or treating seizures -bexarotene -griseofulvin -medicines to treat seizures like carbamazepine, ethotoin, felbamate, oxcarbazepine, phenytoin, topiramate -modafinil -pioglitazone -rifabutin -rifampin -rifapentine -some medicines to treat HIV infection like atazanavir, indinavir, lopinavir, nelfinavir, tipranavir, ritonavir -St. John's wort -warfarin This list may not describe all possible interactions. Give your health care provider a list of all the medicines, herbs, non-prescription drugs, or dietary supplements you use. Also tell them if you smoke, drink alcohol, or use illegal drugs. Some items may interact with your medicine. What should I watch for while using this medicine? Visit your doctor or health care professional for regular check ups. See your doctor if you or your partner has sexual contact with others, becomes HIV positive, or gets a sexual transmitted disease. This product does not protect you against HIV infection (AIDS) or other sexually transmitted diseases. You can check the placement of the IUD yourself by reaching up to the top of your vagina with clean fingers to feel the threads. Do not pull on the threads. It is a good habit to check placement after each menstrual period. Call your doctor right away if you feel more of the IUD than just the threads or if you cannot feel the threads at all. The IUD may come out by itself. You may become pregnant if the device comes out. If you notice that the IUD has come out use a backup birth control method like condoms and call your health care provider. Using tampons will not change the position of the IUD and are okay to use during your period. What side effects may   I notice from receiving this medicine? Side effects that you should report to your doctor or  health care professional as soon as possible: -allergic reactions like skin rash, itching or hives, swelling of the face, lips, or tongue -fever, flu-like symptoms -genital sores -high blood pressure -no menstrual period for 6 weeks during use -pain, swelling, warmth in the leg -pelvic pain or tenderness -severe or sudden headache -signs of pregnancy -stomach cramping -sudden shortness of breath -trouble with balance, talking, or walking -unusual vaginal bleeding, discharge -yellowing of the eyes or skin Side effects that usually do not require medical attention (report to your doctor or health care professional if they continue or are bothersome): -acne -breast pain -change in sex drive or performance -changes in weight -cramping, dizziness, or faintness while the device is being inserted -headache -irregular menstrual bleeding within first 3 to 6 months of use -nausea This list may not describe all possible side effects. Call your doctor for medical advice about side effects. You may report side effects to FDA at 1-800-FDA-1088. Where should I keep my medicine? This does not apply. NOTE: This sheet is a summary. It may not cover all possible information. If you have questions about this medicine, talk to your doctor, pharmacist, or health care provider.  2015, Elsevier/Gold Standard. (2011-05-24 13:54:04)  

## 2014-03-02 NOTE — Progress Notes (Signed)
Subjective:     Renee Lane is a 53 y.o. female who presents to the clinic 4 weeks status post D&C/hysteroscopy/attempted endometrial ablation for abnormal uterine bleeding. Eating a regular diet without difficulty. Bowel movements are normal. The patient is not having any pain.  The following portions of the patient's history were reviewed and updated as appropriate: allergies, current medications, past family history, past medical history, past social history, past surgical history and problem list.  Review of Systems Pertinent items are noted in HPI.    Objective:    BP 120/83  Pulse 74  Temp(Src) 98.7 F (37.1 C)  Ht 5' 7.5" (1.715 m)  Wt 96.163 kg (212 lb)  BMI 32.69 kg/m2  LMP 01/09/2014 General:  alert  Abdomen: soft, bowel sounds active, non-tender       Assessment:    Doing well postoperatively. Operative findings again reviewed. Pathology report/management options discussed.    Plan:   Continue any current medications.  Follow up for a Mirena IUD insertion

## 2014-03-08 ENCOUNTER — Encounter: Payer: Self-pay | Admitting: Obstetrics & Gynecology

## 2014-03-08 ENCOUNTER — Ambulatory Visit (INDEPENDENT_AMBULATORY_CARE_PROVIDER_SITE_OTHER): Payer: 59 | Admitting: Obstetrics & Gynecology

## 2014-03-08 VITALS — BP 122/81 | HR 72 | Temp 98.2°F | Ht 67.5 in | Wt 216.0 lb

## 2014-03-08 DIAGNOSIS — N939 Abnormal uterine and vaginal bleeding, unspecified: Secondary | ICD-10-CM

## 2014-03-10 DIAGNOSIS — N939 Abnormal uterine and vaginal bleeding, unspecified: Secondary | ICD-10-CM | POA: Insufficient documentation

## 2014-03-10 NOTE — Progress Notes (Signed)
IUD Insertion Procedure Note  Pre-operative Diagnosis: Abnormal uterine bleeding  Post-operative Diagnosis: Same  Indications: See above  Procedure Details    The risks (including infection, bleeding, pain, and uterine perforation) and benefits of the procedure were explained to the patient and Written informed consent was obtained.    Cervix cleansed with Betadine. Uterus sounded to 7 cm. IUD inserted without difficulty. String visible and trimmed. Patient tolerated procedure well.  IUD Information: Mirena.  Condition: Stable  Complications: None  Plan:  The patient was advised to call for any fever or for prolonged or severe pain or bleeding. She was advised to use OTC analgesics as needed for mild to moderate pain.

## 2014-03-10 NOTE — Patient Instructions (Signed)
IUD PLACEMENT POST-PROCEDURE INSTRUCTIONS  1. You may take Ibuprofen, Aleve or Tylenol for pain if needed.  Cramping should resolve within in 24 hours.  2. You may have a small amount of spotting.  You should wear a mini pad for the next few days.  3. You may have intercourse after 24 hours.  If you using this for birth control, it is effective immediately.  4. You need to call if you have any pelvic pain, fever, heavy bleeding or foul smelling vaginal discharge.  Irregular bleeding is common the first several months after having an IUD placed. You do not need to call for this reason unless you are concerned.  5. Shower or bathe as normal   

## 2014-04-14 ENCOUNTER — Encounter: Payer: Self-pay | Admitting: *Deleted

## 2014-05-03 ENCOUNTER — Encounter: Payer: Self-pay | Admitting: *Deleted

## 2014-05-04 ENCOUNTER — Encounter: Payer: Self-pay | Admitting: Obstetrics & Gynecology

## 2014-05-12 ENCOUNTER — Ambulatory Visit: Payer: 59 | Admitting: Obstetrics & Gynecology

## 2014-05-21 ENCOUNTER — Telehealth: Payer: Self-pay | Admitting: *Deleted

## 2014-05-21 NOTE — Telephone Encounter (Signed)
Patient contacted the office and has been scheduled for an appointemtn on 05-26-14 for an IUD check.

## 2014-05-21 NOTE — Telephone Encounter (Signed)
Patient called the office stating she currently has a Mirena IUD and is unable to feel the strings and is also having some pain. Patient is requesting an appointment.  Attempted to contact the patient and left message for her to contact the office.

## 2014-05-26 ENCOUNTER — Ambulatory Visit: Payer: Self-pay | Admitting: Obstetrics

## 2014-05-27 ENCOUNTER — Encounter: Payer: Self-pay | Admitting: Obstetrics

## 2014-05-27 ENCOUNTER — Ambulatory Visit (INDEPENDENT_AMBULATORY_CARE_PROVIDER_SITE_OTHER): Payer: 59 | Admitting: Obstetrics

## 2014-05-27 VITALS — BP 115/75 | HR 67 | Temp 98.6°F | Ht 67.5 in | Wt 226.0 lb

## 2014-05-27 DIAGNOSIS — R102 Pelvic and perineal pain: Secondary | ICD-10-CM

## 2014-05-27 DIAGNOSIS — Z30431 Encounter for routine checking of intrauterine contraceptive device: Secondary | ICD-10-CM

## 2014-05-28 ENCOUNTER — Encounter: Payer: Self-pay | Admitting: Obstetrics

## 2014-05-28 NOTE — Progress Notes (Signed)
Patient ID: Renee Lane, female   DOB: 09-Apr-1961, 54 y.o.   MRN: [id number]  Chief Complaint  Patient presents with  . Problem    Right lower abdominal pain    HPI Renee Lane is a 54 y.o. female.  Presents with pelvic pain since IUD insertion.  HPI  Past Medical History  Diagnosis Date  . GERD (gastroesophageal reflux disease)   . Lupus     Past Surgical History  Procedure Laterality Date  . C sections    . Foot surgery    . Cholecystectomy    . Tubal ligation    . Colonoscopy    . Dilitation & currettage/hystroscopy with novasure ablation N/A 02/05/2014    Procedure: DILATATION & CURETTAGE/HYSTEROSCOPY WITH NOVASURE ABLATION;  Surgeon: Antionette CharLisa Jackson-Moore, MD;  Location: [facility];  Service: Gynecology;  Laterality: N/A;    Family History  Problem Relation Age of Onset  . Cancer Mother   . COPD Mother   . Diabetes Mother   . Heart disease Mother     Social History History  Substance Use Topics  . Smoking status: Never Smoker   . Smokeless tobacco: Never Used  . Alcohol Use: No    Allergies  Allergen Reactions  . Percocet [Oxycodone-Acetaminophen] Rash    Current Outpatient Prescriptions  Medication Sig Dispense Refill  . aspirin 81 MG tablet Take 81 mg by mouth daily.    . Cyanocobalamin (VITAMIN B 12 PO) Take 1 tablet by mouth daily.    Marland Kitchen. loratadine (CLARITIN) 10 MG tablet Take 10 mg by mouth daily as needed for allergies.    . misoprostol (CYTOTEC) 200 MCG tablet Place 200mcg per vagina 4 hours before procedure. 1 tablet 0   No current facility-administered medications for this visit.    Review of Systems Review of Systems Constitutional: negative for fatigue and weight loss Respiratory: negative for cough and wheezing Cardiovascular: negative for chest pain, fatigue and palpitations Gastrointestinal: negative for abdominal pain and change in bowel habits Genitourinary: pelvic pain Integument/breast: negative for nipple  discharge Musculoskeletal:negative for myalgias Neurological: negative for gait problems and tremors Behavioral/Psych: negative for abusive relationship, depression Endocrine: negative for temperature intolerance     Blood pressure 115/75, pulse 67, temperature 98.6 F (37 C), height 5' 7.5" (1.715 m), weight 226 lb (102.513 kg).  Physical Exam Physical Exam General:   alert  Skin:   no rash or abnormalities  Lungs:   clear to auscultation bilaterally  Heart:   regular rate and rhythm, S1, S2 normal, no murmur, click, rub or gallop  Breasts:   normal without suspicious masses, skin or nipple changes or axillary nodes  Abdomen:  normal findings: no organomegaly, soft, non-tender and no hernia  Pelvis:  External genitalia: normal general appearance Urinary system: urethral meatus normal and bladder without fullness, nontender Vaginal: normal without tenderness, induration or masses Cervix: normal appearance.  IUD string not visible. Adnexa: normal bimanual exam Uterus: anteverted and non-tender, normal size      Data Reviewed Labs   Assessment    Pelvic pain after IUD insertion    Plan  Ultrasound ordered for IUD Placement. F/U 2 weeks.    Orders Placed This Encounter  Procedures  . US Pelvis Complete    Standing Status: Future     Number of Occurrences:      Standing Expiration Date: 07/26/2015    Order Specific Question:  Reason for Exam (SYMPTOM  OR DIAGNOSIS REQUIRED)    Answer:  Pelvic  pain.  IUD Placement.    Order Specific Question:  Preferred imaging location?    Answer:  [facility]  . US Transvaginal Non-OB    Standing Status: Future     Number of Occurrences:      Standing Expiration Date: 07/26/2015    Order Specific Question:  Reason for Exam (SYMPTOM  OR DIAGNOSIS REQUIRED)    Answer:  Pelvic pain.  IUD placement.    Order Specific Question:  Preferred imaging location?    Answer:  [facility]   No orders of the defined types were placed  in this encounter.

## 2014-06-16 ENCOUNTER — Other Ambulatory Visit: Payer: Self-pay | Admitting: Obstetrics

## 2014-06-16 ENCOUNTER — Ambulatory Visit (INDEPENDENT_AMBULATORY_CARE_PROVIDER_SITE_OTHER): Payer: 59

## 2014-06-16 DIAGNOSIS — R1031 Right lower quadrant pain: Secondary | ICD-10-CM

## 2014-06-16 DIAGNOSIS — Z30431 Encounter for routine checking of intrauterine contraceptive device: Secondary | ICD-10-CM

## 2014-06-16 DIAGNOSIS — R102 Pelvic and perineal pain: Secondary | ICD-10-CM

## 2015-05-13 ENCOUNTER — Other Ambulatory Visit (HOSPITAL_COMMUNITY): Payer: Self-pay | Admitting: Obstetrics & Gynecology

## 2015-05-13 DIAGNOSIS — R102 Pelvic and perineal pain: Secondary | ICD-10-CM

## 2015-05-13 DIAGNOSIS — N939 Abnormal uterine and vaginal bleeding, unspecified: Secondary | ICD-10-CM

## 2015-05-13 DIAGNOSIS — Z01419 Encounter for gynecological examination (general) (routine) without abnormal findings: Secondary | ICD-10-CM

## 2015-05-23 ENCOUNTER — Ambulatory Visit (HOSPITAL_COMMUNITY)
Admission: RE | Admit: 2015-05-23 | Discharge: 2015-05-23 | Disposition: A | Discharge status: Home / Self Care | Payer: 59 | Source: Ambulatory Visit | Attending: Obstetrics & Gynecology | Admitting: Obstetrics & Gynecology

## 2015-05-23 DIAGNOSIS — Z01419 Encounter for gynecological examination (general) (routine) without abnormal findings: Secondary | ICD-10-CM

## 2015-05-23 DIAGNOSIS — N939 Abnormal uterine and vaginal bleeding, unspecified: Secondary | ICD-10-CM | POA: Diagnosis not present

## 2015-05-23 DIAGNOSIS — R102 Pelvic and perineal pain: Secondary | ICD-10-CM

## 2015-07-22 ENCOUNTER — Emergency Department
Admission: EM | Admit: 2015-07-22 | Discharge: 2015-07-22 | Disposition: A | Discharge status: Home / Self Care | Payer: 59 | Source: Home / Self Care | Attending: Family Medicine | Admitting: Family Medicine

## 2015-07-22 ENCOUNTER — Encounter: Payer: Self-pay | Admitting: Emergency Medicine

## 2015-07-22 ENCOUNTER — Emergency Department (INDEPENDENT_AMBULATORY_CARE_PROVIDER_SITE_OTHER): Payer: 59

## 2015-07-22 DIAGNOSIS — M79604 Pain in right leg: Secondary | ICD-10-CM

## 2015-07-22 DIAGNOSIS — M545 Low back pain, unspecified: Secondary | ICD-10-CM

## 2015-07-22 DIAGNOSIS — M25551 Pain in right hip: Secondary | ICD-10-CM | POA: Diagnosis not present

## 2015-07-22 LAB — POCT URINALYSIS DIP (MANUAL ENTRY)
Bilirubin, UA: NEGATIVE
Glucose, UA: NEGATIVE
Ketones, POC UA: NEGATIVE
Leukocytes, UA: NEGATIVE
Nitrite, UA: NEGATIVE
Protein Ur, POC: NEGATIVE
Spec Grav, UA: <=1.005 (ref 1.005–1.03)
Urobilinogen, UA: 0.2 (ref 0–1)
pH, UA: 5 (ref 5–8)

## 2015-07-22 MED ORDER — PREDNISONE 20 MG PO TABS: ORAL_TABLET | ORAL | Status: DC

## 2015-07-22 MED ORDER — CYCLOBENZAPRINE HCL 10 MG PO TABS: ORAL_TABLET | ORAL | Status: DC

## 2015-07-22 NOTE — Discharge Instructions (Signed)
Apply ice pack for 20 to 30 minutes, 3 to 4 times daily  Continue until pain decreases.  °

## 2015-07-22 NOTE — ED Notes (Signed)
Right mid-back pain radiates into rt buttock, hip x 4 days

## 2015-07-22 NOTE — ED Provider Notes (Signed)
CSN: [id number]     Arrival date & time 07/22/15  1531 History   First MD Initiated Contact with Patient 07/22/15 1609     Chief Complaint  Patient presents with  . Back Pain      HPI Comments: Patient complains of 4 day history of pain in her right lower mid-back that radiates to her right hip and buttock.  She recalls no injury.  No urinary symptoms.   She denies bowel or bladder dysfunction, and no saddle numbness.    Patient is a 55 y.o. female presenting with back pain. The history is provided by the patient.  Back Pain Location:  Lumbar spine and gluteal region Quality:  Aching and shooting Radiates to: right buttock and hip. Pain severity:  Moderate Pain is:  Same all the time Onset quality:  Sudden Duration:  4 days Progression:  Unchanged Chronicity:  New Context: not recent injury   Relieved by:  Nothing Worsened by:  Movement, standing and lying down Ineffective treatments:  None tried Associated symptoms: leg pain   Associated symptoms: no abdominal pain, no bladder incontinence, no bowel incontinence, no dysuria, no numbness, no paresthesias, no pelvic pain, no perianal numbness, no tingling, no weakness and no weight loss   Risk factors: lack of exercise     Past Medical History  Diagnosis Date  . GERD (gastroesophageal reflux disease)   . Lupus [facility](HCC)    Past Surgical History  Procedure Laterality Date  . C sections    . Foot surgery    . Cholecystectomy    . Tubal ligation    . Colonoscopy    . Dilitation & currettage/hystroscopy with novasure ablation N/A 02/05/2014    Procedure: DILATATION & CURETTAGE/HYSTEROSCOPY WITH NOVASURE ABLATION;  Surgeon: Antionette CharLisa Jackson-Moore, MD;  Location: [facility];  Service: Gynecology;  Laterality: N/A;   Family History  Problem Relation Age of Onset  . Cancer Mother   . COPD Mother   . Diabetes Mother   . Heart disease Mother    Social History  Substance Use Topics  . Smoking status: Never Smoker   . Smokeless tobacco:  Never Used  . Alcohol Use: No   OB History    Gravida Para Term Preterm AB TAB SAB Ectopic Multiple Living   4 4 4  0 0 0 0 0 0 4     Review of Systems  Constitutional: Negative for weight loss.  Gastrointestinal: Negative for abdominal pain and bowel incontinence.  Genitourinary: Negative for bladder incontinence, dysuria and pelvic pain.  Musculoskeletal: Positive for back pain.  Neurological: Negative for tingling, weakness, numbness and paresthesias.  All other systems reviewed and are negative.   Allergies  Percocet  Home Medications   Prior to Admission medications   Medication Sig Start Date End Date Taking? Authorizing Provider  esomeprazole (NEXIUM) 20 MG capsule Take 20 mg by mouth daily at 12 noon.   Yes Historical Provider, MD  aspirin 81 MG tablet Take 81 mg by mouth daily.    Historical Provider, MD  Cyanocobalamin (VITAMIN B 12 PO) Take 1 tablet by mouth daily.    Historical Provider, MD  cyclobenzaprine (FLEXERIL) 10 MG tablet Take one tab by mouth at bedtime for muscle spasm 07/22/15   Lattie HawStephen A Beese, MD  loratadine (CLARITIN) 10 MG tablet Take 10 mg by mouth daily as needed for allergies.    Historical Provider, MD  misoprostol (CYTOTEC) 200 MCG tablet Place 200mcg per vagina 4 hours before procedure. 03/01/14   Misty StanleyLisa  Tamela Oddi, MD  predniSONE (DELTASONE) 20 MG tablet Take one tab by mouth twice daily for 5 days, then one daily. Take with food. 07/22/15   Lattie Haw, MD   Meds Ordered and Administered this Visit  Medications - No data to display  BP 117/78 mmHg  Pulse 78  Temp(Src) 98.4 F (36.9 C) (Oral)  Ht  (1.702 m)  Wt 227 lb (102.967 kg)  BMI 35.55 kg/m2  SpO2 98% No data found.   Physical Exam  Constitutional: She is oriented to person, place, and time. She appears well-developed and well-nourished. No distress.  Patient is obese (BMI 35.6)  HENT:  Head: Atraumatic.  Nose: Nose normal.  Mouth/Throat: Oropharynx is clear and  moist.  Eyes: Pupils are equal, round, and reactive to light.  Neck: Neck supple.  Cardiovascular: Normal heart sounds.   Pulmonary/Chest: Breath sounds normal.  Abdominal: There is no tenderness.  Musculoskeletal:       Legs: There is tenderness to palpation over the right buttock extending to the right greater trochanter as noted on diagram. The right hip reveals distinct tenderness over the greater trochanter.  Palpating the greater trochanter during resisted lateral abduction of the hip recreates some of her pain.  There is also tenderness over the right inguinal area extending to the pubic symphysis.   The back has decreased range of motion.  Straight leg raising test is negative.  Sitting knee extension test is negative.  Strength and sensation in the lower extremities is normal.  Unable to elicit patellar reflexes with augmentation.  Lymphadenopathy:    She has no cervical adenopathy.  Neurological: She is alert and oriented to person, place, and time.  Skin: Skin is warm and dry. No rash noted.  Nursing note and vitals reviewed.   ED Course  Procedures none    Labs Reviewed  POCT URINALYSIS DIP (MANUAL ENTRY) - Abnormal; Notable for the following:    Blood, UA trace-intact (*)    All other components within normal limits    Imaging Review Dg Hip Unilat With Pelvis 2-3 Views Right  07/22/2015  CLINICAL DATA:  Right hip pain for 4 days, no known injury, initial encounter EXAM: DG HIP (WITH OR WITHOUT PELVIS) 2-3V RIGHT COMPARISON:  None FINDINGS: Pelvic ring is intact. An IUD is noted in place. No acute fracture or dislocation is seen. No gross soft tissue abnormality is noted. IMPRESSION: No acute abnormality noted. Electronically Signed   By: Alcide Clever M.D.   On: 07/22/2015 16:46      MDM   1. Low back pain radiating to right lower extremity    Suspect right hip trochanteric bursitis also Begin prednisone burst and taper.  Flexeril  HS Apply ice pack for 20 to  30 minutes, 3 to 4 times daily  Continue until pain decreases.  Followup with Dr. Rodney Langton or Dr. Clementeen Graham ([facility]) in about 5 days.    Lattie Haw, MD 07/26/15 [phone]

## 2015-07-25 ENCOUNTER — Telehealth: Payer: Self-pay | Admitting: *Deleted

## 2015-07-25 ENCOUNTER — Ambulatory Visit (INDEPENDENT_AMBULATORY_CARE_PROVIDER_SITE_OTHER): Payer: 59 | Admitting: Family Medicine

## 2015-07-25 DIAGNOSIS — Z5329 Procedure and treatment not carried out because of patient's decision for other reasons: Secondary | ICD-10-CM

## 2015-07-25 NOTE — Progress Notes (Signed)
No show

## 2015-08-01 ENCOUNTER — Encounter: Payer: Self-pay | Admitting: Family Medicine

## 2015-08-01 ENCOUNTER — Ambulatory Visit (INDEPENDENT_AMBULATORY_CARE_PROVIDER_SITE_OTHER): Payer: 59 | Admitting: Family Medicine

## 2015-08-01 DIAGNOSIS — Z5329 Procedure and treatment not carried out because of patient's decision for other reasons: Secondary | ICD-10-CM

## 2015-08-01 NOTE — Progress Notes (Signed)
No show/Late

## 2016-10-17 IMAGING — US US PELVIS COMPLETE
1 series · 15 of 25 positions shown · non-contrast
Comparison: None

CLINICAL DATA: Abnormal uterine bleeding.

EXAM:
TRANSABDOMINAL AND TRANSVAGINAL ULTRASOUND OF PELVIS
TECHNIQUE: Both transabdominal and transvaginal ultrasound examinations of the
pelvis were performed. Transabdominal technique was performed for
global imaging of the pelvis including uterus, ovaries, adnexal
regions, and pelvic cul-de-sac. It was necessary to proceed with
endovaginal exam following the transabdominal exam to visualize the
uterus and ovaries.

[Series 1: us pelvis complete · 39 acquisitions, 15 frames shown]
[im 1/39]
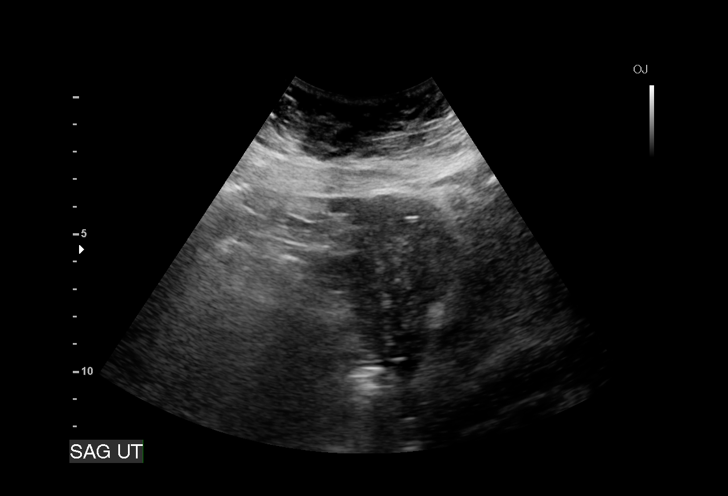
[im 4/39]
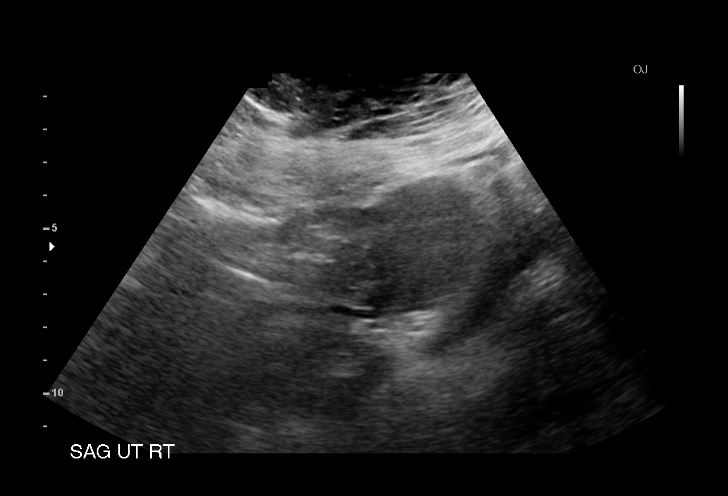
[im 7/39]
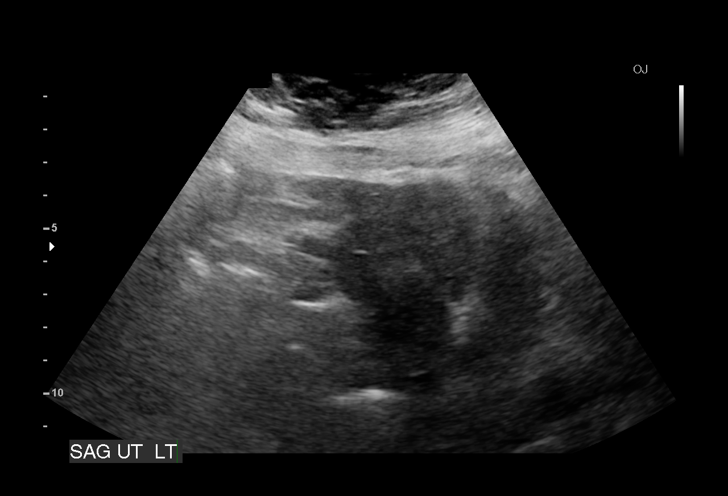
[im 8/39]
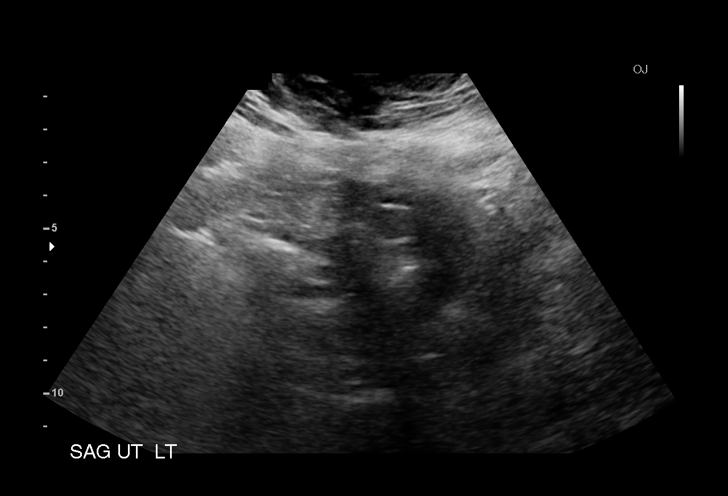
[im 12/39]
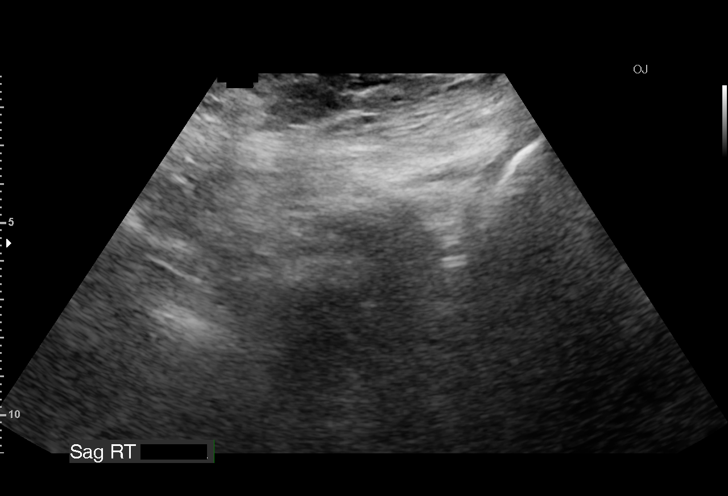
[im 15/39]
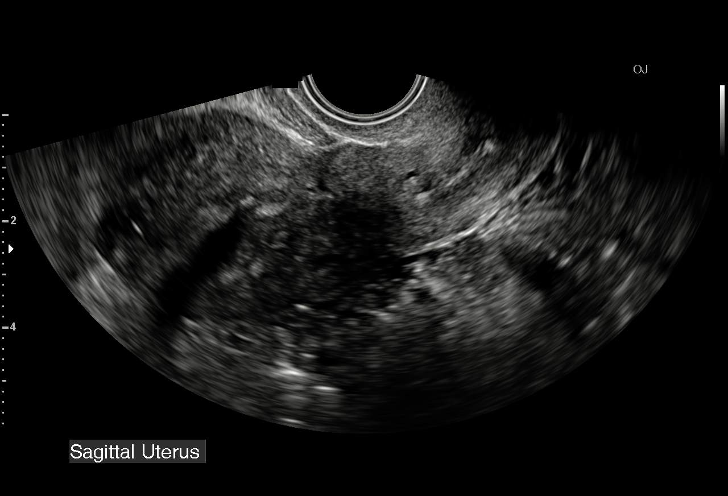
[im 16/39]
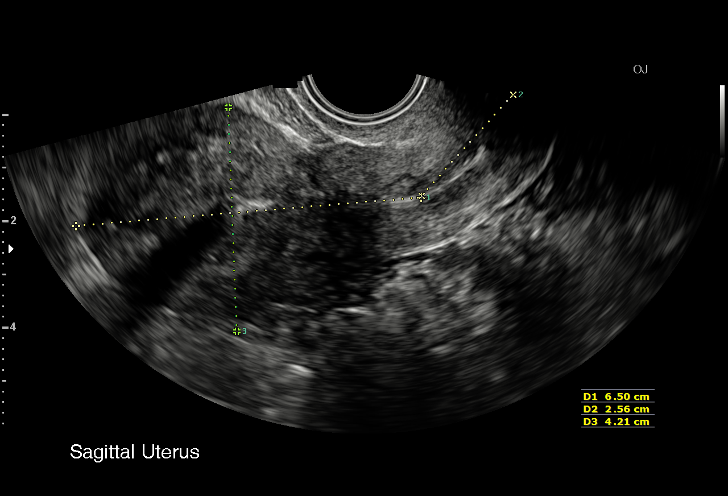
[im 20/39]
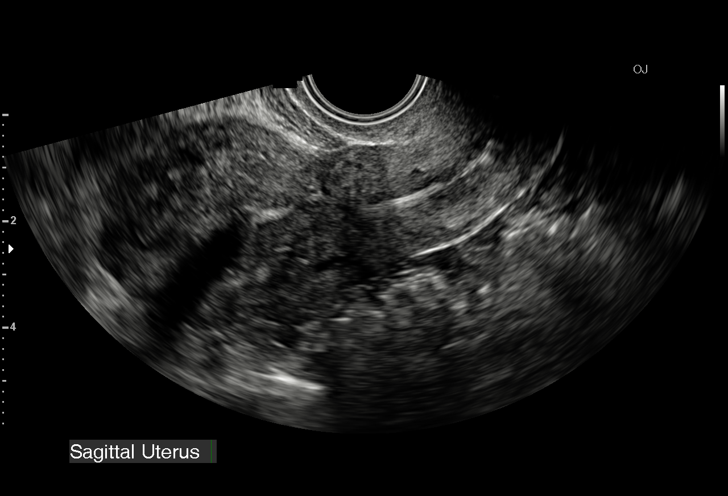
[im 23/39]
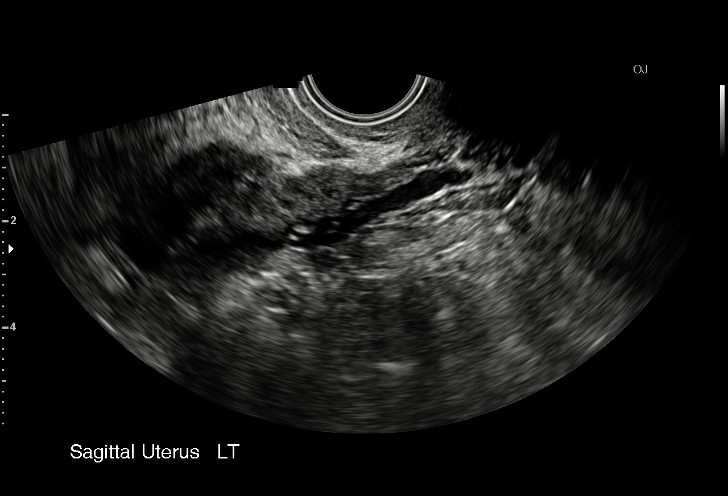
[im 24/39]
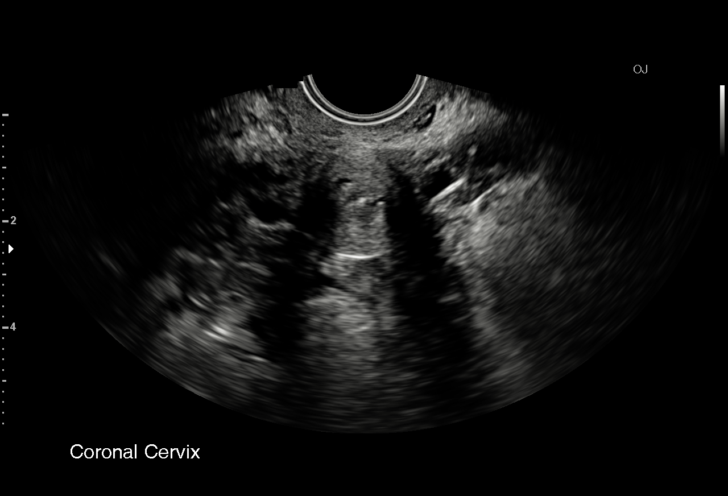
[im 27/39]
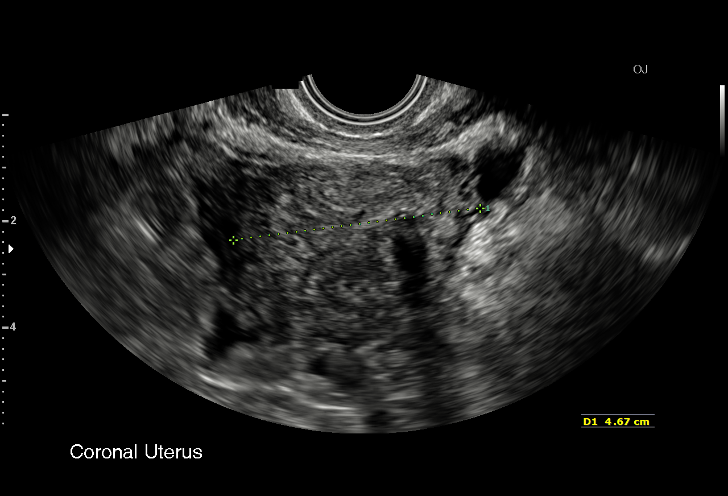
[im 31/39]
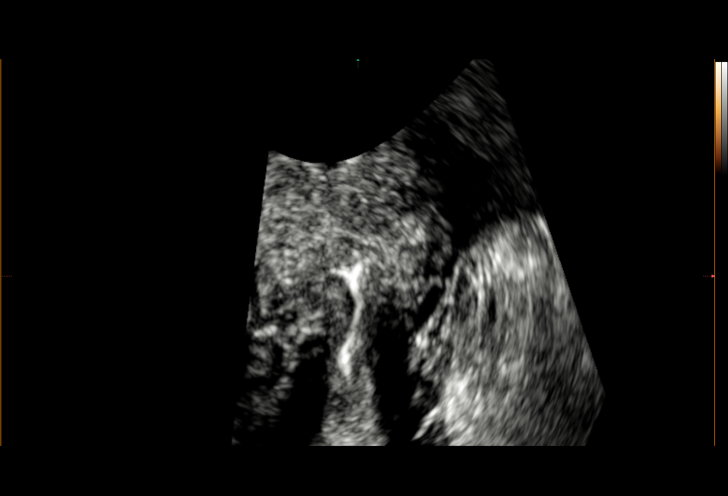
[im 32/39]
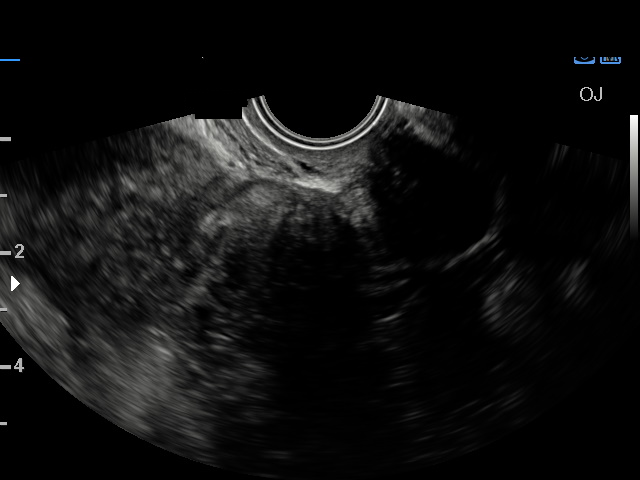
[im 35/39]
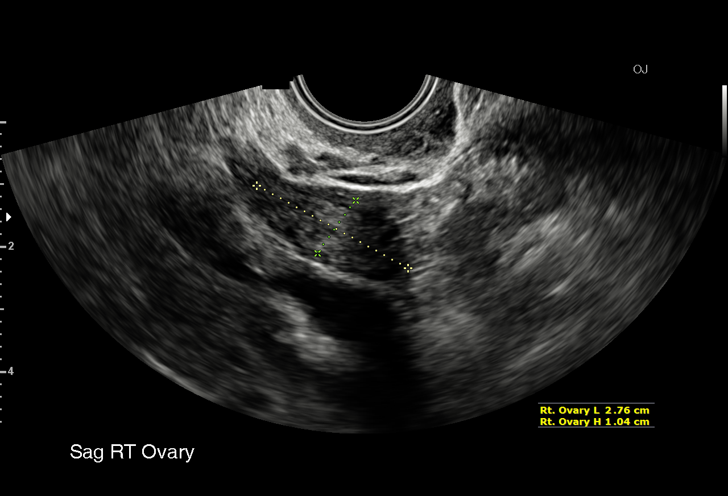
[im 39/39]
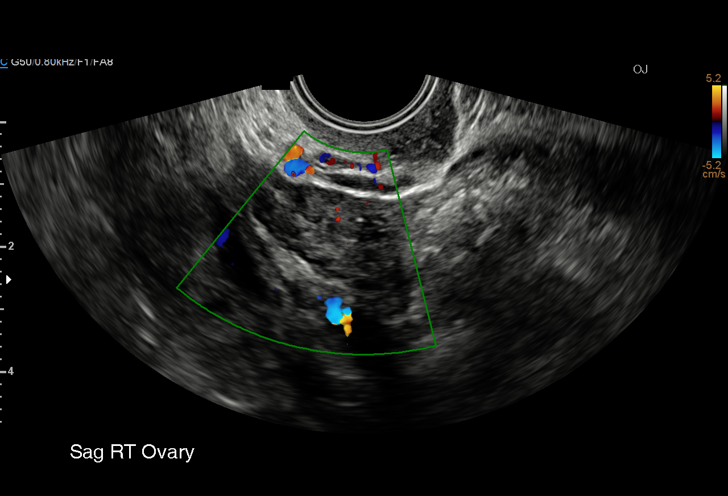

[15 of 25 positions shown; findings below may reference images not displayed]

FINDINGS: Uterus

Measurements: 9.1 x 4.2 x 4.7 cm. No fibroids or other mass
visualized.

Endometrium

Thickness: 3.0 mm. No focal abnormality visualized. IUD visualized.

Right ovary

Measurements: 2.8 x 1.0 x 1.9 cm. Normal appearance/no adnexal mass.

Left ovary

Not visualized.

Other findings

No abnormal free fluid.
IMPRESSION: IUD visualized in the endometrial canal. Exam is otherwise
unremarkable.

## 2016-12-16 IMAGING — CR DG HIP (WITH OR WITHOUT PELVIS) 2-3V*R*
3 series · 3 of 3 positions shown · non-contrast
Comparison: None

CLINICAL DATA: Right hip pain for 4 days, no known injury, initial
encounter

EXAM:
DG HIP (WITH OR WITHOUT PELVIS) 2-3V RIGHT

[pelvis ap]
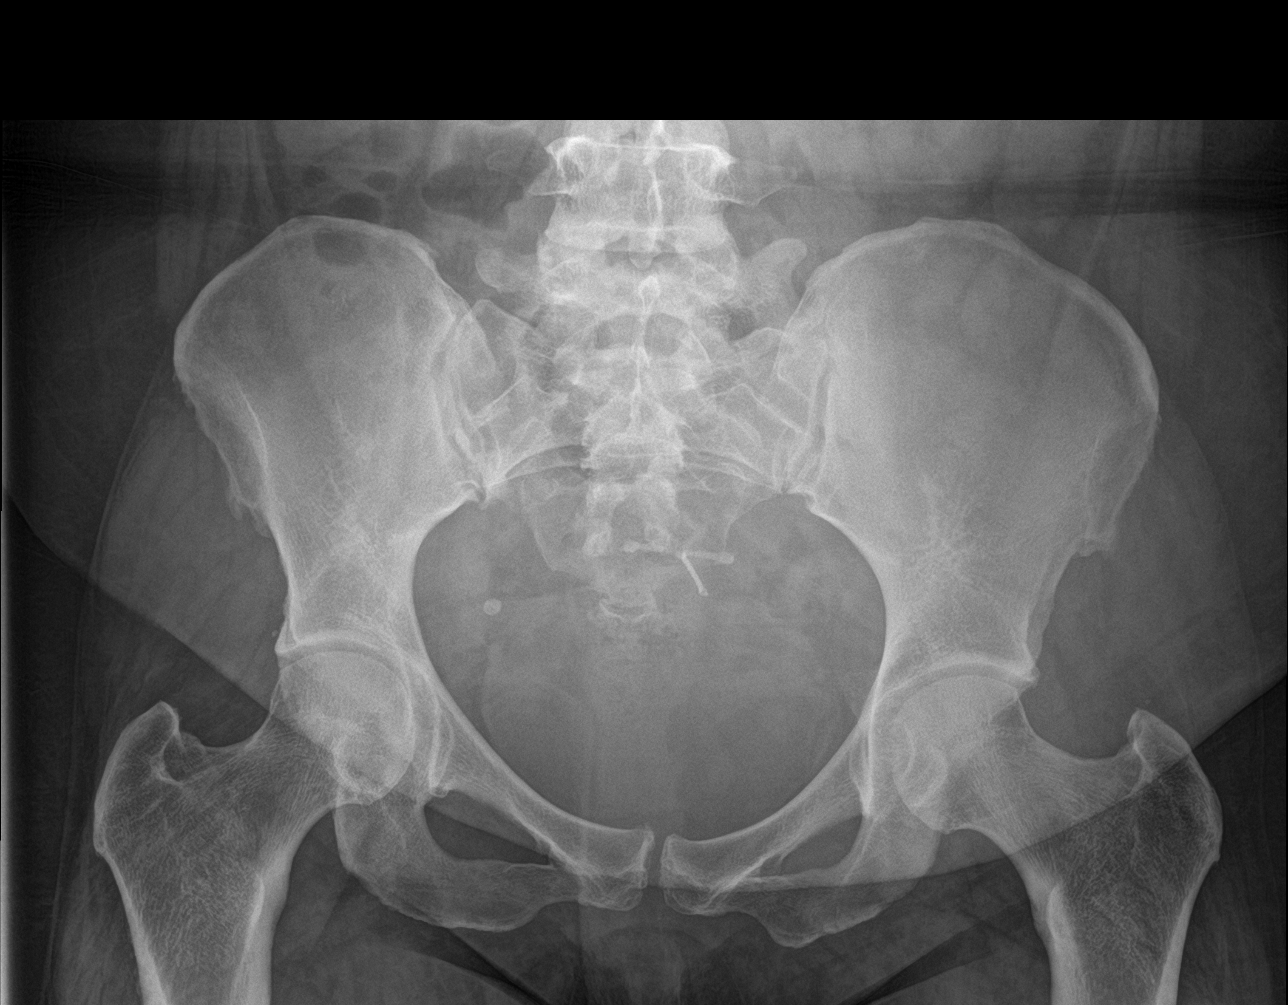

[hip ap]
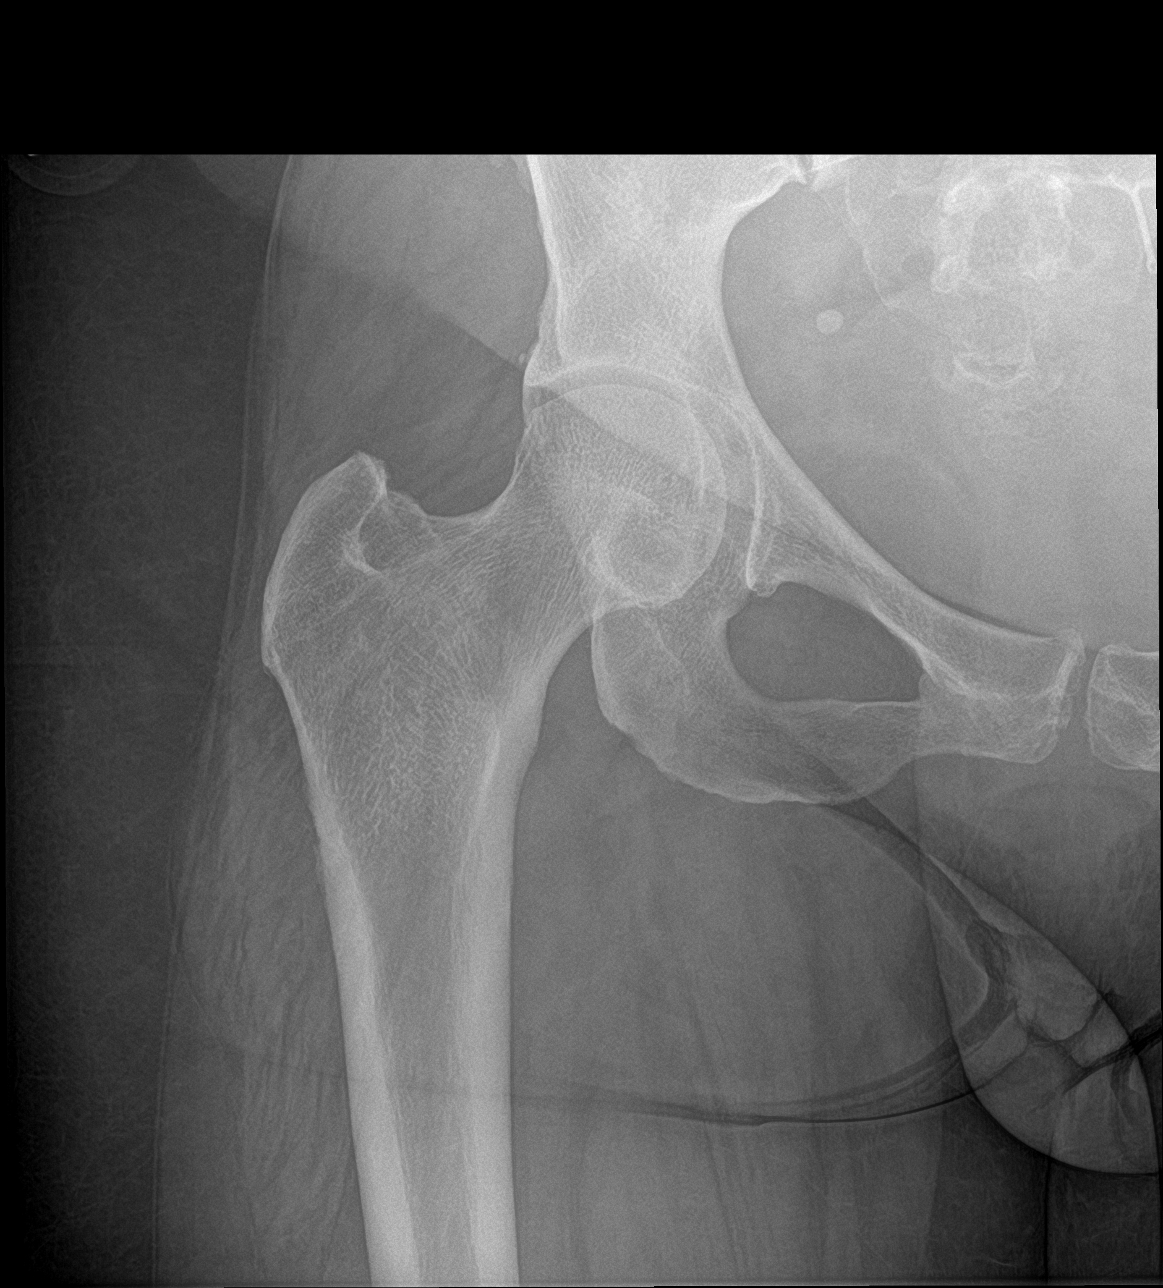

[hip lat]
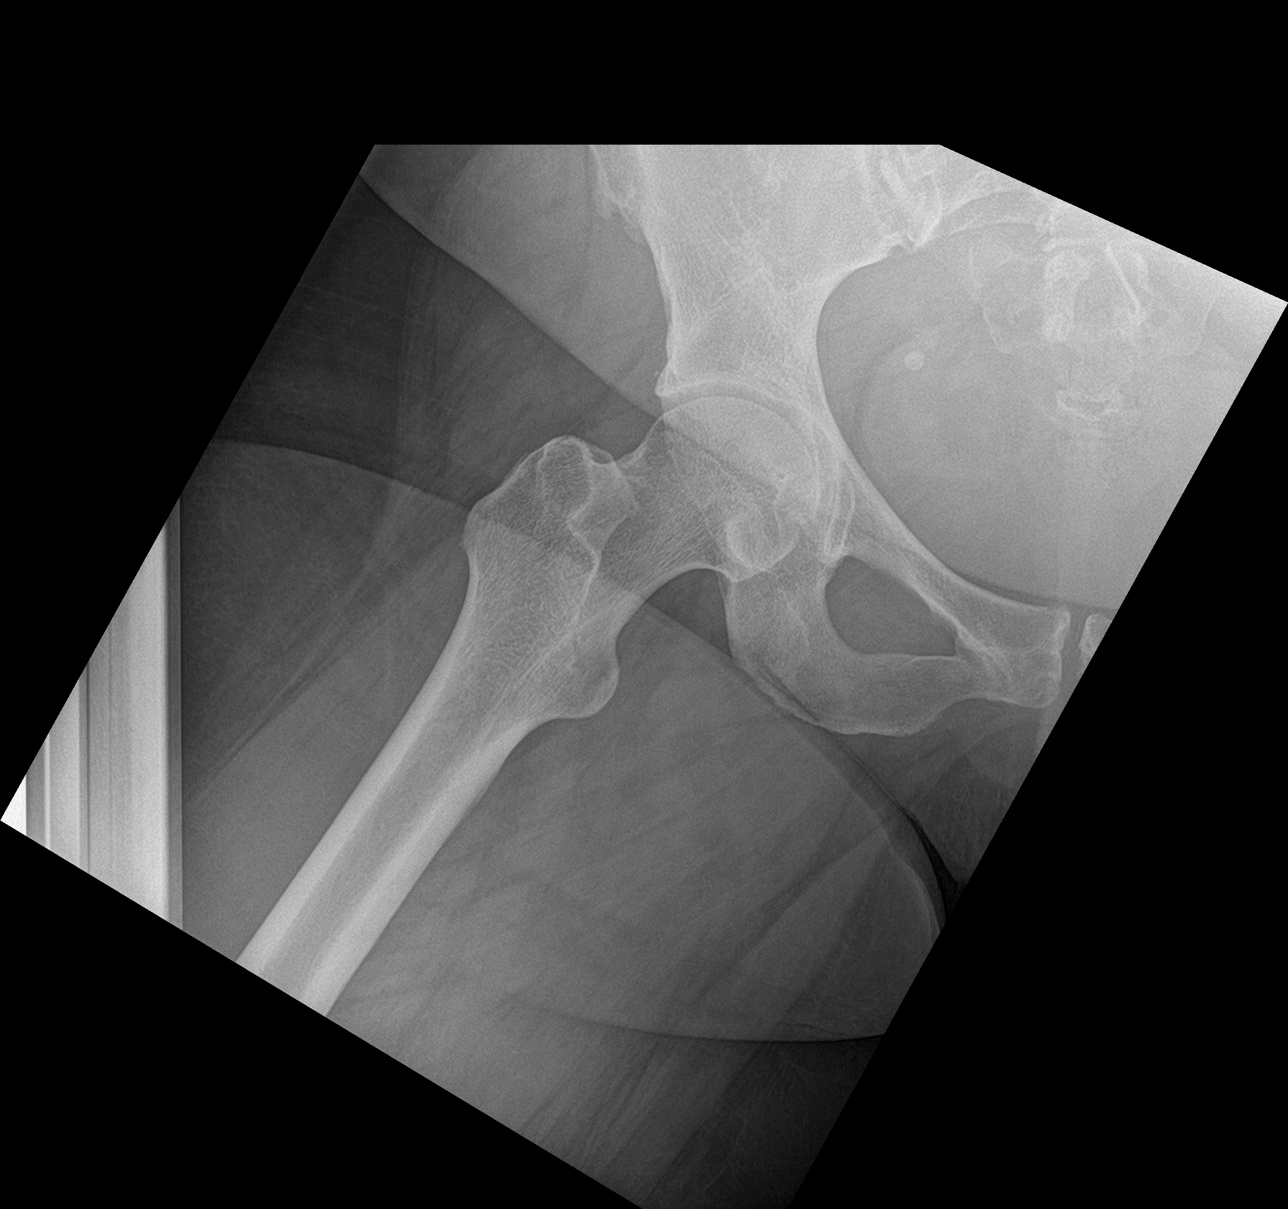

[3 of 3 positions shown; findings below may reference images not displayed]

FINDINGS: Pelvic ring is intact. An IUD is noted in place. No acute fracture
or dislocation is seen. No gross soft tissue abnormality is noted.
IMPRESSION: No acute abnormality noted.

## 2017-02-15 ENCOUNTER — Other Ambulatory Visit: Payer: Self-pay | Admitting: Orthopedic Surgery

## 2017-02-15 MED ORDER — ONDANSETRON 4 MG PO TBDP: 4.0000 mg | ORAL_TABLET | Freq: Three times a day (TID) | ORAL | 0 refills | Status: DC | PRN

## 2017-06-11 ENCOUNTER — Encounter: Payer: Self-pay | Admitting: Neurology

## 2017-06-12 ENCOUNTER — Ambulatory Visit: Payer: Self-pay | Admitting: Neurology

## 2017-07-05 HISTORY — PX: IUD REMOVAL: SHX5392

## 2017-07-05 HISTORY — PX: SHOULDER SURGERY: SHX246

## 2017-07-24 ENCOUNTER — Ambulatory Visit: Payer: Self-pay | Admitting: Neurology

## 2017-08-07 ENCOUNTER — Encounter: Payer: Self-pay | Admitting: *Deleted

## 2017-08-08 ENCOUNTER — Ambulatory Visit: Payer: BLUE CROSS/BLUE SHIELD | Admitting: Neurology

## 2017-08-08 ENCOUNTER — Encounter: Payer: Self-pay | Admitting: Neurology

## 2017-08-08 VITALS — BP 136/89 | HR 64 | Ht 67.0 in | Wt 237.4 lb

## 2017-08-08 DIAGNOSIS — M542 Cervicalgia: Secondary | ICD-10-CM | POA: Diagnosis not present

## 2017-08-08 DIAGNOSIS — M549 Dorsalgia, unspecified: Secondary | ICD-10-CM

## 2017-08-08 DIAGNOSIS — G8929 Other chronic pain: Secondary | ICD-10-CM | POA: Diagnosis not present

## 2017-08-08 DIAGNOSIS — R202 Paresthesia of skin: Secondary | ICD-10-CM

## 2017-08-08 NOTE — Progress Notes (Signed)
[facility]    Provider:  Dr Jaynee Eagles Referring Provider: Windell Hummingbird, PA-C, Dr. Suella Broad Primary Care Physician:  Windell Hummingbird, PA-C  CC:  Back pain and neck pain  HPI:  Renee Lane is a 57 y.o. female here as a referral from Dr. Lovena Le and Dr. Nelva Bush for lumbar spondylosis with myelopathy or radiculopathy.  Past medical history trochanteric bursitis of left hip, impingement syndrome of left shoulder, lumbar strain, cervical pain, chronic left shoulder pain, adhesive capsulitis of left shoulder, thoracic spine pain, SI joint dysfunction, lumbar facet arthropathy, degenerative lumbar disc, tendinosis of the left shoulder. She fell on ice in 2017 in December. She experienced neck pain and low back pain and shoulder problems. She has impingement capsulitis. She has been been to a Restaurant manager, fast food and PT at [facility]. Pain is in her low back and her hips hurt no radiation, the left side of the neck hurts withour radiation. She has tingling in the fingers. Also dull and numb an no strength to open. She has restless legs her feet feel numb.   Reviewed notes, labs and imaging from outside physicians, which showed:  MRI of the lumbar spine was largely unremarkable L2-L3 small broad right paracentral to lateral disc protrusion without mass-effect in the nerve roots, L3-L4 mild disc bulge moderate facet arthrosis and redundant ligamentum flavum resulting in mild central canal stenosis, L4-L5 mild disc bulge to moderate right facet arthrosis.  MRI of the cervical spine showed no evidence of herniated disc or compression fracture.  Reviewed referring physician notes.  Patient was being seen for back pain.  Also neck pain.  Reports pain, aching and throbbing, current treatment includes activity modification and pain medications.  She has been treated with hydrocodone.  The current pain level is 8 out of 10.  They have been going to a chiropractor.  She is not doing any better.   She has applied for long-term disability.  She complains of left shoulder pain.  She has been released by Dr. Stann Mainland.  She continues to complain of pain in her back radiating into her right lower limb she feels like she is depressed.  Very guarded and moving both of her arms she does not need an assistive device to ambulate, she is alert and oriented.  She is managed with hydrocodone and Cymbalta.  She has had injections.  Epidural steroid injections L5-S1, midline no relief.    Review of Systems: Patient complains of symptoms per HPI as well as the following symptoms: headache, numbness, dizziness, insomnia, restless legs, fatigue, weight gain, depression, anxiety. Pertinent negatives and positives per HPI. All others negative.   Social History   Socioeconomic History  . Marital status: Single    Spouse name: Not on file  . Number of children: 4  . Years of education: college certificate   . Highest education level: Some college, no degree  Occupational History  . Not on file  Social Needs  . Financial resource strain: Not on file  . Food insecurity:    Worry: Not on file    Inability: Not on file  . Transportation needs:    Medical: Not on file    Non-medical: Not on file  Tobacco Use  . Smoking status: Never Smoker  . Smokeless tobacco: Never Used  Substance and Sexual Activity  . Alcohol use: No    Alcohol/week: 0.0 oz  . Drug use: No  . Sexual activity: Not Currently    Birth control/protection: Surgical  Lifestyle  .  Physical activity:    Days per week: Not on file    Minutes per session: Not on file  . Stress: Not on file  Relationships  . Social connections:    Talks on phone: Not on file    Gets together: Not on file    Attends religious service: Not on file    Active member of club or organization: Not on file    Attends meetings of clubs or organizations: Not on file    Relationship status: Not on file  . Intimate partner violence:    Fear of current or ex  partner: Not on file    Emotionally abused: Not on file    Physically abused: Not on file    Forced sexual activity: Not on file  Other Topics Concern  . Not on file  Social History Narrative   Lives at home with her son (he lives with her to help her)   Right handed   Drinks no caffeine    Family History  Problem Relation Age of Onset  . Cancer Mother   . COPD Mother   . Diabetes Mother   . Heart disease Mother   . Diabetes Maternal Grandmother   . Heart disease Maternal Grandmother   . Rheum arthritis Maternal Grandmother   . Cancer Maternal Grandfather     Past Medical History:  Diagnosis Date  . Adhesive capsulitis of left shoulder   . Chronic left shoulder pain   . DDD (degenerative disc disease), lumbar   . GERD (gastroesophageal reflux disease)   . Tendinosis of left shoulder   . Thoracic spine pain   . Trochanteric bursitis of left hip     Past Surgical History:  Procedure Laterality Date  . c sections    . CHOLECYSTECTOMY    . COLONOSCOPY    . DILITATION & CURRETTAGE/HYSTROSCOPY WITH NOVASURE ABLATION N/A 02/05/2014   Procedure: DILATATION & CURETTAGE/HYSTEROSCOPY WITH NOVASURE ABLATION;  Surgeon: Lahoma Crocker, MD;  Location: [facility];  Service: Gynecology;  Laterality: N/A;  . FOOT SURGERY    . IUD REMOVAL  07/2017  . SHOULDER SURGERY Left 07/2017   for adhesive capsulitis; Dr. Vernon Prey @ [facility]  . TUBAL LIGATION      Current Outpatient Medications  Medication Sig Dispense Refill  . Cyanocobalamin (VITAMIN B 12 PO) Take 1 tablet by mouth daily.    . DULoxetine (CYMBALTA) 20 MG capsule Take 20 mg by mouth daily.    Marland Kitchen esomeprazole (NEXIUM) 20 MG capsule Take 20 mg by mouth daily at 12 noon.    Marland Kitchen HYDROcodone-acetaminophen (NORCO) 7.5-325 MG tablet Take 1 tablet by mouth 2 (two) times daily as needed.    . loratadine (CLARITIN) 10 MG tablet Take 10 mg by mouth daily as needed for allergies.    Marland Kitchen ondansetron (ZOFRAN ODT) 4 MG  disintegrating tablet Take 1 tablet (4 mg total) by mouth every 8 (eight) hours as needed for nausea or vomiting. 20 tablet 0  . SUMAtriptan (IMITREX) 100 MG tablet Take 100 mg by mouth as needed. May repeat if needed. Do not take more than 2 tablets in 24 hours.     No current facility-administered medications for this visit.     Allergies as of 08/08/2017 - Review Complete 08/08/2017  Allergen Reaction Noted  . Percocet [oxycodone-acetaminophen] Rash 06/11/2011    Vitals: BP 136/89 (BP Location: Left Arm, Patient Position: Sitting)   Pulse 64   Ht '5\' 7"'  (1.702 m)   Wt 237  lb 6.4 oz (107.7 kg)   BMI 37.18 kg/m  Last Weight:  Wt Readings from Last 1 Encounters:  08/08/17 237 lb 6.4 oz (107.7 kg)   Last Height:   Ht Readings from Last 1 Encounters:  08/08/17 '5\' 7"'  (1.702 m)         Assessment/Plan:    Physical exam: Exam: Gen: NAD, conversant, well nourised, obese, well groomed                     CV: RRR, no MRG. No Carotid Bruits. No peripheral edema, warm, nontender Eyes: Conjunctivae clear without exudates or hemorrhage  Neuro: Detailed Neurologic Exam  Speech:    Speech is normal; fluent and spontaneous with normal comprehension.  Cognition:    The patient is oriented to person, place, and time;     recent and remote memory intact;     language fluent;     normal attention, concentration,     fund of knowledge Cranial Nerves:    The pupils are equal, round, and reactive to light. The fundi are normal and spontaneous venous pulsations are present. Visual fields are full to finger confrontation. Extraocular movements are intact. Trigeminal sensation is intact and the muscles of mastication are normal. The face is symmetric. The palate elevates in the midline. Hearing intact. Voice is normal. Shoulder shrug is normal. The tongue has normal motion without fasciculations.   Coordination:    Normal finger to nose and heel to shin. Normal rapid alternating  movements.   Gait:    Antalgic  Motor Observation:    No asymmetry, no atrophy, and no involuntary movements noted. Tone:    Normal muscle tone.    Posture:    Posture is normal. normal erect    Strength: proximal lower extremity weakness due to pain, appears intact to strength with giveway. Otherwise strength is V/V in the upper and lower limbs.      Sensation: intact to LT     Reflex Exam:  DTR's:    Deep tendon reflexes in the upper and lower extremities are normal bilaterally.   Toes:    The toes are downgoing bilaterally.   Clonus:    Clonus is absent.   A/P: 56 year old with chronic neck and back pain since falling several years ago. She has had a good workup including MRI of the cervical spine and MRI of the lumbar spine which were unremarkable, epidural steroid injections, chiropractic care and physical therapy.   -Do not have anything to offer her for pain management this appears to be musculoskeletal in nature.  Advised her the best thing that she can exercise, heat, physical therapy and follow with Dr. Nelva Bush for pain management  -We can perform an EMG nerve conduction study of the upper extremities due to numbness and tingling in the fingers to rule out other causes such as carpal tunnel syndrome  -We will also perform some labs to make sure there are not any concomitant issues such as rheumatoid arthritis or B12 deficiency which could contribute to her symptoms  -We will perform workup above but likely patient will not be following with neurology and will return to Dr. Nelva Bush  - Fall risk, discussed precautions  Orders Placed This Encounter  Procedures  . TSH  . HIV antibody  . ANA w/Reflex  . RPR  . Hepatitis C antibody  . Rheumatoid factor  . Heavy metals, blood  . Vitamin B6  . Multiple Myeloma Panel (SPEP&IFE w/QIG)  .  Vitamin B1  . Hemoglobin A1c  . Methylmalonic acid, serum  . NCV with EMG(electromyography)   Cc: Windell Hummingbird, PA-C, Dr.  Suella Broad  [place], [facility] [address] [place], [place] [address]  Phone [phone] Fax [phone]

## 2017-08-08 NOTE — Patient Instructions (Signed)
Labs and emg/ncs   Electromyoneurogram Electromyoneurogram is a test to check how well your muscles and nerves are working. This procedure includes the combined use of electromyogram (EMG) and nerve conduction study (NCS). EMG is used to look for muscular disorders. NCS, which is also called electroneurogram, measures how well your nerves are controlling your muscles. The procedures are usually performed together to check if your muscles and nerves are healthy. If the reaction to testing is abnormal, this can indicate disease or injury, such as peripheral nerve damage. Tell a health care provider about:  Any allergies you have.  All medicines you are taking, including vitamins, herbs, eye drops, creams, and over-the-counter medicines.  Any problems you or family members have had with anesthetic medicines.  Any blood disorders you have.  Any surgeries you have had.  Any medical conditions you have.  Any pacemaker you have. What are the risks? Generally, this is a safe procedure. However, problems may occur, including:  Infection where the electrodes were inserted.  Bleeding.  What happens before the procedure?  Ask your health care provider about: ? Changing or stopping your regular medicines. This is especially important if you are taking diabetes medicines or blood thinners. ? Taking medicines such as aspirin and ibuprofen. These medicines can thin your blood. Do not take these medicines before your procedure if your health care provider instructs you not to.  Your health care provider may ask you to avoid: ? Caffeine, such as coffee and tea. ? Nicotine. This includes cigarettes and anything with tobacco.  Do not use lotions or creams on the same day that you will be having the procedure. What happens during the procedure? For EMG:  Your health care provider will ask you to stay in a position so that he or she can access the muscle that will be studied. You may be standing,  sitting down, or lying down.  You may be given a medicine that numbs the area (local anesthetic).  A very thin needle that has an electrode on it will be inserted into your muscle.  Another small electrode will be placed on your skin near the muscle.  Your health care provider will ask you to continue to remain still.  The electrodes will send a signal that tells about the electrical activity of your muscles. You may see this on a monitor or hear it in the room.  After your muscles have been studied at rest, your health care provider will ask you to contract or flex your muscles. The electrodes will send a signal that tells about the electrical activity of your muscles.  Your health care provider will remove the electrodes and the electrode needles when the procedure is finished. The procedure may vary among health care providers and hospitals. For NCS:  An electrode that records your nerve activity (recording electrode) will be placed on your skin by the muscle that is being studied.  An electrode that is used as a reference (reference electrode) will be placed near the recording electrode.  A paste or gel will be applied to your skin between the recording electrode and the reference electrode.  Your nerve will be stimulated with a mild shock. Your health care provider will measure how much time it takes for your muscle to react.  Your health care provider will remove the electrodes and the gel when the procedure is finished. The procedure may vary among health care providers and hospitals. What happens after the procedure?  It is your  responsibility to obtain your test results. Ask your health care provider or the department performing the test when and how you will get your results.  Your health care provider may: ? Give you medicines for any pain. ? Monitor the insertion sites to make sure that they stop bleeding. This information is not intended to replace advice given to you  by your health care provider. Make sure you discuss any questions you have with your health care provider. Document Released: 08/24/2004 Document Revised: 09/29/2015 Document Reviewed: 06/14/2014 Elsevier Interactive Patient Education  2018 [employer]    Fall Prevention in the Home Falls can cause injuries. They can happen to people of all ages. There are many things you can do to make your home safe and to help prevent falls. What can I do on the outside of my home?  Regularly fix the edges of walkways and driveways and fix any cracks.  Remove anything that might make you trip as you walk through a door, such as a raised step or threshold.  Trim any bushes or trees on the path to your home.  Use bright outdoor lighting.  Clear any walking paths of anything that might make someone trip, such as rocks or tools.  Regularly check to see if handrails are loose or broken. Make sure that both sides of any steps have handrails.  Any raised decks and porches should have guardrails on the edges.  Have any leaves, snow, or ice cleared regularly.  Use sand or salt on walking paths during winter.  Clean up any spills in your garage right away. This includes oil or grease spills. What can I do in the bathroom?  Use night lights.  Install grab bars by the toilet and in the tub and shower. Do not use towel bars as grab bars.  Use non-skid mats or decals in the tub or shower.  If you need to sit down in the shower, use a plastic, non-slip stool.  Keep the floor dry. Clean up any water that spills on the floor as soon as it happens.  Remove soap buildup in the tub or shower regularly.  Attach bath mats securely with double-sided non-slip rug tape.  Do not have throw rugs and other things on the floor that can make you trip. What can I do in the bedroom?  Use night lights.  Make sure that you have a light by your bed that is easy to reach.  Do not use any sheets or blankets that  are too big for your bed. They should not hang down onto the floor.  Have a firm chair that has side arms. You can use this for support while you get dressed.  Do not have throw rugs and other things on the floor that can make you trip. What can I do in the kitchen?  Clean up any spills right away.  Avoid walking on wet floors.  Keep items that you use a lot in easy-to-reach places.  If you need to reach something above you, use a strong step stool that has a grab bar.  Keep electrical cords out of the way.  Do not use floor polish or wax that makes floors slippery. If you must use wax, use non-skid floor wax.  Do not have throw rugs and other things on the floor that can make you trip. What can I do with my stairs?  Do not leave any items on the stairs.  Make sure that there are handrails  on both sides of the stairs and use them. Fix handrails that are broken or loose. Make sure that handrails are as long as the stairways.  Check any carpeting to make sure that it is firmly attached to the stairs. Fix any carpet that is loose or worn.  Avoid having throw rugs at the top or bottom of the stairs. If you do have throw rugs, attach them to the floor with carpet tape.  Make sure that you have a light switch at the top of the stairs and the bottom of the stairs. If you do not have them, ask someone to add them for you. What else can I do to help prevent falls?  Wear shoes that: ? Do not have high heels. ? Have rubber bottoms. ? Are comfortable and fit you well. ? Are closed at the toe. Do not wear sandals.  If you use a stepladder: ? Make sure that it is fully opened. Do not climb a closed stepladder. ? Make sure that both sides of the stepladder are locked into place. ? Ask someone to hold it for you, if possible.  Clearly mark and make sure that you can see: ? Any grab bars or handrails. ? First and last steps. ? Where the edge of each step is.  Use tools that help you  move around (mobility aids) if they are needed. These include: ? Canes. ? Walkers. ? Scooters. ? Crutches.  Turn on the lights when you go into a dark area. Replace any light bulbs as soon as they burn out.  Set up your furniture so you have a clear path. Avoid moving your furniture around.  If any of your floors are uneven, fix them.  If there are any pets around you, be aware of where they are.  Review your medicines with your doctor. Some medicines can make you feel dizzy. This can increase your chance of falling. Ask your doctor what other things that you can do to help prevent falls. This information is not intended to replace advice given to you by your health care provider. Make sure you discuss any questions you have with your health care provider. Document Released: 02/17/2009 Document Revised: 09/29/2015 Document Reviewed: 05/28/2014 Elsevier Interactive Patient Education  2018 [employer].      Musculoskeletal Pain Musculoskeletal pain is muscle and bone aches and pains. This pain can occur in any part of the body. Follow these instructions at home:  Only take medicines for pain, discomfort, or fever as told by your health care provider.  You may continue all activities unless the activities cause more pain. When the pain lessens, slowly resume normal activities. Gradually increase the intensity and duration of the activities or exercise.  During periods of severe pain, bed rest may be helpful. Lie or sit in any position that is comfortable, but get out of bed and walk around at least every several hours.  If directed, put ice on the injured area. ? Put ice in a plastic bag. ? Place a towel between your skin and the bag. ? Leave the ice on for 20 minutes, 2-3 times a day. Contact a health care provider if:  Your pain is getting worse.  Your pain is not relieved with medicines.  You lose function in the area of the pain if the pain is in your arms, legs, or  neck. This information is not intended to replace advice given to you by your health care provider. Make sure you discuss  any questions you have with your health care provider. Document Released: 04/23/2005 Document Revised: 10/04/2015 Document Reviewed: 12/26/2012 Elsevier Interactive Patient Education  2017 [employer].

## 2017-08-13 LAB — METHYLMALONIC ACID, SERUM: Methylmalonic Acid: 132 nmol/L (ref 0–378)

## 2017-08-13 LAB — MULTIPLE MYELOMA PANEL, SERUM
Albumin SerPl Elph-Mcnc: 3.3 g/dL (ref 2.9–4.4)
Albumin/Glob SerPl: 1 (ref 0.7–1.7)
Alpha 1: 0.2 g/dL (ref 0.0–0.4)
Alpha2 Glob SerPl Elph-Mcnc: 0.8 g/dL (ref 0.4–1.0)
B-Globulin SerPl Elph-Mcnc: 1.3 g/dL (ref 0.7–1.3)
Gamma Glob SerPl Elph-Mcnc: 1 g/dL (ref 0.4–1.8)
Globulin, Total: 3.4 g/dL (ref 2.2–3.9)
IgA/Immunoglobulin A, Serum: 313 mg/dL (ref 87–352)
IgG (Immunoglobin G), Serum: 1181 mg/dL (ref 700–1600)
IgM (Immunoglobulin M), Srm: 30 mg/dL (ref 26–217)
Total Protein: 6.7 g/dL (ref 6.0–8.5)

## 2017-08-13 LAB — TSH: TSH: 2.52 u[IU]/mL (ref 0.450–4.500)

## 2017-08-13 LAB — HEMOGLOBIN A1C
Est. average glucose Bld gHb Est-mCnc: 123 mg/dL
Hgb A1c MFr Bld: 5.9 % — ABNORMAL HIGH (ref 4.8–5.6)

## 2017-08-13 LAB — RPR: RPR Ser Ql: NONREACTIVE

## 2017-08-13 LAB — ENA+DNA/DS+SJORGEN'S
ENA RNP Ab: 2.5 AI — ABNORMAL HIGH (ref 0.0–0.9)
ENA SM Ab Ser-aCnc: 0.2 AI (ref 0.0–0.9)
ENA SSA (RO) Ab: <0.2 AI (ref 0.0–0.9)
ENA SSB (LA) Ab: <0.2 AI (ref 0.0–0.9)
dsDNA Ab: 1 IU/mL (ref 0–9)

## 2017-08-13 LAB — HEPATITIS C ANTIBODY: Hep C Virus Ab: <0.1 s/co ratio (ref 0.0–0.9)

## 2017-08-13 LAB — HIV ANTIBODY (ROUTINE TESTING W REFLEX): HIV Screen 4th Generation wRfx: NONREACTIVE

## 2017-08-13 LAB — HEAVY METALS, BLOOD
Arsenic: 8 ug/L (ref 2–23)
Lead, Blood: NOT DETECTED ug/dL (ref 0–4)
Mercury: NOT DETECTED ug/L (ref 0.0–14.9)

## 2017-08-13 LAB — VITAMIN B6: Vitamin B6: 7.3 ug/L (ref 2.0–32.8)

## 2017-08-13 LAB — ANA W/REFLEX: Anti Nuclear Antibody(ANA): POSITIVE — AB

## 2017-08-13 LAB — RHEUMATOID FACTOR: Rhuematoid fact SerPl-aCnc: <10 IU/mL (ref 0.0–13.9)

## 2017-08-13 LAB — VITAMIN B1: Thiamine: 90.4 nmol/L (ref 66.5–200.0)

## 2017-08-15 ENCOUNTER — Telehealth: Payer: Self-pay | Admitting: *Deleted

## 2017-08-15 DIAGNOSIS — R768 Other specified abnormal immunological findings in serum: Secondary | ICD-10-CM

## 2017-08-15 NOTE — Telephone Encounter (Addendum)
Spoke with patient regarding her lab results. Discussed that she has positive autoimmune antibodies which may indicate an autoimmune disorder. However I am not sure if this is significant or not, a lot of people actually test positive here and do not have an autoimmune disorder - a portion of the population has these antibodies but are normal. Dr. Lucia GaskinsAhern would refer to Rheumatology for a more thorough evaluation if patient agrees. Pt verbalized understanding and appreciation and stated that she would like the referral to rheumatology.    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 However I am not sure if this is significant or not, a lot of people actually test positive here and do not have an autoimmune disorder - a portion of the population has these antibodies but are normal. I would refer to Rheumatology for a more thorough evaluation. If patient agrees please place referral. Happy to call her if she likes but I think Rheumatology would have more answers than me. thanks

## 2017-08-19 NOTE — Telephone Encounter (Signed)
Referral has been sent to Dr. Corliss Skainseveshwar telephone [phone] - fax [phone]. Labs attached .

## 2017-09-19 ENCOUNTER — Ambulatory Visit (INDEPENDENT_AMBULATORY_CARE_PROVIDER_SITE_OTHER): Payer: BLUE CROSS/BLUE SHIELD | Admitting: Neurology

## 2017-09-19 ENCOUNTER — Ambulatory Visit: Payer: BLUE CROSS/BLUE SHIELD | Admitting: Neurology

## 2017-09-19 DIAGNOSIS — R202 Paresthesia of skin: Secondary | ICD-10-CM | POA: Diagnosis not present

## 2017-09-19 DIAGNOSIS — G5603 Carpal tunnel syndrome, bilateral upper limbs: Secondary | ICD-10-CM

## 2017-09-19 DIAGNOSIS — Z0289 Encounter for other administrative examinations: Secondary | ICD-10-CM

## 2017-09-19 DIAGNOSIS — G5622 Lesion of ulnar nerve, left upper limb: Secondary | ICD-10-CM

## 2017-09-19 NOTE — Progress Notes (Addendum)
Send results to Sr. Ramos and Dr. 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 Patient would like to be referred to orthopaedics for eval of surgery. Also reviewed labs (see following) which were unremarkable except for +ANA, +ENA, HgbA1c 5.9, she was referred to Rheumatology.     TSH  . HIV antibody  . ANA w/Reflex  . RPR  . Hepatitis C antibody  . Rheumatoid factor  . Heavy metals, blood  . Vitamin B6  . Multiple Myeloma Panel (SPEP&IFE w/QIG)  . Vitamin B1  . Hemoglobin A1c  . Methylmalonic acid, serum      A total of 15 minutes was spent face-to-face with this patient. Over half this time was spent on counseling patient on the CTS diagnosis and different therapeutic options,  risks ans benefits of management, compliance, or risk factor reduction and education.  This does no tinclude any time spent on perfmornint the emg/ncs procedure

## 2017-09-23 NOTE — Progress Notes (Addendum)
      Full Name: Renee Lane Gender: Female MRN #: [id number] Date of Birth: 07/07/1960    Visit Date: 09/19/17 08:15 Age: 57 Years 3 Months Old Examining Physician: Geraldine Sandberg, MD  Referring Physician: Ramos, MD  History: EMG nerve conduction study of the upper extremities due to numbness and tingling in the fingers to rule out causes such as carpal tunnel syndrome  Summary: EMG/NCS was performed on the upper extremities  Summary:   Nerve Conduction Studies were performed on the bilateral upper extremities.  The right Median 2nd Digit orthodromic sensory nerve showed borderline distal peak latency (3.4 ms, N<3.4) and borderline amplitude(10 uV, N>10) The left  Median 2nd Digit orthodromic sensory nerve showed prolonged distal peak latency (3.5 ms, N<3.4) and reduced amplitude(8 uV, N>10) The Left Ulnar ADM motor nerve showed reduced amplitude (4.5mV,N>6)  and decreased conduction velocity (A Elbow-B Elbow, 42 m/s)  There is a 10m/s drop in velocity across the elbow. (N < 10m/s)   The left  Ulnar 5th Digit orthodromic sensory nerve showed borderline distal peak latency (3.1 ms, N<3.1) and reduced amplitude(2 uV, N>5)  F Wave studies indicate that the left Ulnar F wave has delayed latency(32.4, N<32ms).  The right median/ulnar (palm) comparison nerve showed prolonged distal peak latency (Median Palm, 2.3 ms, N<2.2) and abnormal peak latency difference (Median Palm-Ulnar Palm, 0.4 ms, N<0.4) with a relative median delay.    The left median/ulnar (palm) comparison nerve showed prolonged distal peak latency (Median Palm, 2.6 ms, N<2.2) and abnormal peak latency difference (Median Palm-Ulnar Palm, 0.5 ms, N<0.4) with a relative median delay.     Conclusion: This is an abnormal study. There is electrophysiologic evidence of bilateral mild Carpal Tunnel Syndrome. 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  No suggestion of polyneuropathy or radiculopathy.     Voncile Schwarz M.D.  [facility] [address] [place], [place] [address] Tel: [phone] Fax: [phone]        MNC    Nerve / Sites Muscle Latency Ref. Amplitude Ref. Rel Amp Segments Distance Velocity Ref. Area    ms ms mV mV %  cm m/s m/s mVms  R Median - APB     Wrist APB 3.2 ?4.4 7.0 ?4.0 100 Wrist - APB 7   23.8     Upper arm APB 8.1  6.3  90.7 Upper arm - Wrist 24 50 ?49 21.8  L Median - APB     Wrist APB 3.4 ?4.4 8.5 ?4.0 100 Wrist - APB 7   23.7     Upper arm APB 7.9  8.1  94.8 Upper arm - Wrist 24 54 ?49 22.0  R Ulnar - ADM     Wrist ADM 2.5 ?3.3 6.2 ?6.0 100 Wrist - ADM 7   17.3     B.Elbow ADM 7.1  5.9  95.5 B.Elbow - Wrist 23 50 ?49 16.9     A.Elbow ADM 8.6  5.4  92.1 A.Elbow - B.Elbow 8 53 ?49 16.2         A.Elbow - Wrist      L Ulnar - ADM     Wrist ADM 2.9 ?3.3 4.5 ?6.0 100 Wrist - ADM 7   13.6     B.Elbow ADM 7.1  4.0  87.9 B.Elbow - Wrist 22 52 ?49 13.2     A.Elbow ADM 9.5  3.2  80.5 A.Elbow - B.Elbow 10 42 ?49 10.2           A.Elbow - Wrist                 SNC    Nerve / Sites Rec. Site Peak Lat Ref.  Amp Ref. Segments Distance Peak Diff Ref.    ms ms V V  cm ms ms  R Median, Ulnar - Transcarpal comparison     Median Palm Wrist 2.3 ?2.2 31 ?35 Median Palm - Wrist 8       Ulnar Palm Wrist 2.0 ?2.2 8 ?12 Ulnar Palm - Wrist 8          Median Palm - Ulnar Palm  0.4 ?0.4  L Median, Ulnar - Transcarpal comparison     Median Palm Wrist 2.6 ?2.2 13 ?35 Median Palm - Wrist 8       Ulnar Palm Wrist 2.1 ?2.2 6 ?12 Ulnar Palm - Wrist 8          Median Palm - Ulnar Palm  0.5 ?0.4  R Median - Orthodromic (Dig II, Mid palm)     Dig II Wrist 3.4 ?3.4 10 ?10 Dig II - Wrist 13    L Median - Orthodromic (Dig II, Mid palm)     Dig II Wrist 3.5 ?3.4 8 ?10 Dig II - Wrist 13    R Ulnar - Orthodromic, (Dig V, Mid palm)     Dig V Wrist 2.8 ?3.1 5 ?5 Dig V - Wrist 11    L Ulnar - Orthodromic, (Dig V, Mid palm)     Dig V Wrist 3.1 ?3.1 2 ?5 Dig V -  Wrist 11                   F  Wave    Nerve F Lat Ref.   ms ms  R Ulnar - ADM 29.2 ?32.0  L Ulnar - ADM 32.4 ?32.0         EMG full       EMG Summary Table    Spontaneous MUAP Recruitment  Muscle IA Fib PSW Fasc Other Amp Dur. Poly Pattern  L. Cervical paraspinals (low) Normal None None None _______ Normal Normal Normal Normal  R. Cervical paraspinals (low) Normal None None None _______ Normal Normal Normal Normal  L. Deltoid Normal None None None _______ Normal Normal Normal Normal  R. Deltoid Normal None None None _______ Normal Normal Normal Normal  L. Triceps brachii Normal None None None _______ Normal Normal Normal Normal  R. Triceps brachii Normal None None None _______ Normal Normal Normal Normal  L. Pronator teres Normal None None None _______ Normal Normal Normal Normal  R. Pronator teres Normal None None None _______ Normal Normal Normal Normal  L. First dorsal interosseous Normal None None None _______ Normal Normal Normal Normal  R. First dorsal interosseous Normal None None None _______ Normal Normal Normal Normal  L. Opponens pollicis Normal None None None _______ Normal Normal Normal Normal  R. Opponens pollicis Normal None None None _______ Normal Normal Normal Normal  L. Flexor digitorum profundus (Ulnar) Normal None None None _______ Normal Normal Normal Normal      

## 2017-09-23 NOTE — Progress Notes (Signed)
See procedure note.

## 2017-09-23 NOTE — Addendum Note (Signed)
Addended by: Naomie Dean B on: 09/23/2017 08:11 PM   Modules accepted: Level of Service

## 2017-09-23 NOTE — Procedures (Signed)
Full Name: Renee Lane Gender: Female MRN #: [id number] Date of Birth: November 23, 2060    Visit Date: 09/19/17 08:15 Age: 57 Years 3 Months Old Examining Physician: Naomie Dean, MD  Referring Physician: Ethelene Hal, MD  History: EMG nerve conduction study of the upper extremities due to numbness and tingling in the fingers to rule out causes such as carpal tunnel syndrome  Summary: EMG/NCS was performed on the upper extremities  Summary:   Nerve Conduction Studies were performed on the bilateral upper extremities.  The right Median 2nd Digit orthodromic sensory nerve showed borderline distal peak latency (3.4 ms, N<3.4) and borderline amplitude(10 uV, N>10) The left  Median 2nd Digit orthodromic sensory nerve showed prolonged distal peak latency (3.5 ms, N<3.4) and reduced amplitude(8 uV, N>10) The Left Ulnar ADM motor nerve showed reduced amplitude (4.67mV,N>6)  and decreased conduction velocity (A Elbow-B Elbow, 42 m/s)  There is a 62m/s drop in velocity across the elbow. (N < 38m/s)   The left  Ulnar 5th Digit orthodromic sensory nerve showed borderline distal peak latency (3.1 ms, N<3.1) and reduced amplitude(2 uV, N>5)  F Wave studies indicate that the left Ulnar F wave has delayed latency(32.4, N<64ms).  The right median/ulnar (palm) comparison nerve showed prolonged distal peak latency (Median Palm, 2.3 ms, N<2.2) and abnormal peak latency difference (Median Palm-Ulnar Palm, 0.4 ms, N<0.4) with a relative median delay.    The left median/ulnar (palm) comparison nerve showed prolonged distal peak latency (Median Palm, 2.6 ms, N<2.2) and abnormal peak latency difference (Median Palm-Ulnar Palm, 0.5 ms, N<0.4) with a relative median delay.     Conclusion: This is an abnormal study. There is electrophysiologic evidence of bilateral mild Carpal Tunnel Syndrome. 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  No suggestion of polyneuropathy or radiculopathy.     Naomie Dean M.D.  [facility] [address] [place], [place] [address] Tel: [phone] Fax: [phone]        [facility]    Nerve / Sites Muscle Latency Ref. Amplitude Ref. Rel Amp Segments Distance Velocity Ref. Area    ms ms mV mV %  cm m/s m/s mVms  R Median - APB     Wrist APB 3.2 ?4.4 7.0 ?4.0 100 Wrist - APB 7   23.8     Upper arm APB 8.1  6.3  90.7 Upper arm - Wrist 24 50 ?49 21.8  L Median - APB     Wrist APB 3.4 ?4.4 8.5 ?4.0 100 Wrist - APB 7   23.7     Upper arm APB 7.9  8.1  94.8 Upper arm - Wrist 24 54 ?49 22.0  R Ulnar - ADM     Wrist ADM 2.5 ?3.3 6.2 ?6.0 100 Wrist - ADM 7   17.3     B.Elbow ADM 7.1  5.9  95.5 B.Elbow - Wrist 23 50 ?49 16.9     A.Elbow ADM 8.6  5.4  92.1 A.Elbow - B.Elbow 8 53 ?49 16.2         A.Elbow - Wrist      L Ulnar - ADM     Wrist ADM 2.9 ?3.3 4.5 ?6.0 100 Wrist - ADM 7   13.6     B.Elbow ADM 7.1  4.0  87.9 B.Elbow - Wrist 22 52 ?49 13.2     A.Elbow ADM 9.5  3.2  80.5 A.Elbow - B.Elbow 10 42 ?49 10.2  A.Elbow - Wrist                 SNC    Nerve / Sites Rec. Site Peak Lat Ref.  Amp Ref. Segments Distance Peak Diff Ref.    ms ms V V  cm ms ms  R Median, Ulnar - Transcarpal comparison     Median Palm Wrist 2.3 ?2.2 31 ?35 Median Palm - Wrist 8       Ulnar Palm Wrist 2.0 ?2.2 8 ?12 Ulnar Palm - Wrist 8          Median Palm - Ulnar Palm  0.4 ?0.4  L Median, Ulnar - Transcarpal comparison     Median Palm Wrist 2.6 ?2.2 13 ?35 Median Palm - Wrist 8       Ulnar Palm Wrist 2.1 ?2.2 6 ?12 Ulnar Palm - Wrist 8          Median Palm - Ulnar Palm  0.5 ?0.4  R Median - Orthodromic (Dig II, Mid palm)     Dig II Wrist 3.4 ?3.4 10 ?10 Dig II - Wrist 13    L Median - Orthodromic (Dig II, Mid palm)     Dig II Wrist 3.5 ?3.4 8 ?10 Dig II - Wrist 13    R Ulnar - Orthodromic, (Dig V, Mid palm)     Dig V Wrist 2.8 ?3.1 5 ?5 Dig V - Wrist 11    L Ulnar - Orthodromic, (Dig V, Mid palm)     Dig V Wrist 3.1 ?3.1 2 ?5 Dig V -  Wrist 47                   F  Wave    Nerve F Lat Ref.   ms ms  R Ulnar - ADM 29.2 ?32.0  L Ulnar - ADM 32.4 ?32.0         EMG full       EMG Summary Table    Spontaneous MUAP Recruitment  Muscle IA Fib PSW Fasc Other Amp Dur. Poly Pattern  L. Cervical paraspinals (low) Normal None None None _______ Normal Normal Normal Normal  R. Cervical paraspinals (low) Normal None None None _______ Normal Normal Normal Normal  L. Deltoid Normal None None None _______ Normal Normal Normal Normal  R. Deltoid Normal None None None _______ Normal Normal Normal Normal  L. Triceps brachii Normal None None None _______ Normal Normal Normal Normal  R. Triceps brachii Normal None None None _______ Normal Normal Normal Normal  L. Pronator teres Normal None None None _______ Normal Normal Normal Normal  R. Pronator teres Normal None None None _______ Normal Normal Normal Normal  L. First dorsal interosseous Normal None None None _______ Normal Normal Normal Normal  R. First dorsal interosseous Normal None None None _______ Normal Normal Normal Normal  L. Opponens pollicis Normal None None None _______ Normal Normal Normal Normal  R. Opponens pollicis Normal None None None _______ Normal Normal Normal Normal  L. Flexor digitorum profundus (Ulnar) Normal None None None _______ Normal Normal Normal Normal

## 2017-10-03 NOTE — Progress Notes (Signed)
Office Visit Note  Patient: Renee Lane             Date of Birth: [birth year]           MRN: [id number]             PCP: Darryl Lent, PA-C Referring: Anson Fret, MD Visit Date: 10/17/2017 Occupation: @    Subjective:  Positive ANA.   History of Present Illness: Renee Lane is a 57 y.o. female seen in consultation per request of Dr. Lucia Gaskins.  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  She started having lower back pain for which she was seen by Dr. Jillyn Hidden at [facility] orthopedics.  She had MRI of her lumbar spine which was consistent with DDD of the lumbar spine.  She had injections by Dr. Ethelene Hal.  She continues to have lower back pain.  She also started having neck pain.  She had been seeing a chiropractor for the neck pain.  She is scheduled to have lumbar spine fusion next month.  She also had left shoulder joint surgery for impingement.  She was referred to Dr. Lucia Gaskins due to neck pain and left hand paresthesias.  Patient reports that she had nerve conduction velocity which was consistent with carpal tunnel syndrome and ulnar nerve impingement.  She is scheduled to see orthopedic surgeon at [facility].  Dr. Lucia Gaskins also obtain some labs which were positive for ANA.  She was referred to me for evaluation of positive ANA.  She reports swelling in the left knee and left ankle.  Activities of Daily Living:  Patient reports morning stiffness for 24 hours.   Patient Denies nocturnal pain.  Difficulty dressing/grooming: Reports Difficulty climbing stairs: Reports Difficulty getting out of chair: Reports Difficulty using hands for taps, buttons, cutlery, and/or writing: Reports   Review of Systems  Constitutional: Positive for fatigue and weight gain. Negative for activity change, night sweats and weight loss.  HENT: Negative for mouth sores, trouble swallowing, trouble swallowing, mouth dryness and nose dryness.     Eyes: Positive for dryness. Negative for pain, redness and visual disturbance.  Respiratory: Negative for cough, shortness of breath and difficulty breathing.   Cardiovascular: Negative for chest pain, palpitations, hypertension, irregular heartbeat and swelling in legs/feet.  Gastrointestinal: Positive for constipation. Negative for blood in stool and diarrhea.  Endocrine: Negative for cold intolerance, excessive thirst, excessive hunger and increased urination.  Genitourinary: Negative for difficulty urinating and vaginal dryness.  Musculoskeletal: Positive for arthralgias, gait problem, joint pain and morning stiffness. Negative for joint swelling, myalgias, muscle weakness, muscle tenderness and myalgias.  Skin: Negative for color change, rash, hair loss, skin tightness, ulcers and sensitivity to sunlight.  Allergic/Immunologic: Negative for susceptible to infections.  Neurological: Positive for dizziness, light-headedness, numbness and weakness. Negative for memory loss and night sweats.  Hematological: Negative for bruising/bleeding tendency and swollen glands.  Psychiatric/Behavioral: Positive for sleep disturbance. Negative for depressed mood. The patient is not nervous/anxious.     PMFS History:  Patient Active Problem List   Diagnosis Date Noted  . Vestibular migraine 10/17/2017  . SI (sacroiliac) joint dysfunction 10/17/2017  . DDD (degenerative disc disease), lumbar 10/17/2017  . Impingement syndrome of left shoulder 10/17/2017  . Chronic pain syndrome 10/17/2017  . History of gastroesophageal reflux (GERD) 10/17/2017  . Chronic neck and back pain 08/08/2017  . Abnormal uterine bleeding 03/10/2014    Past Medical History:  Diagnosis Date  .  Adhesive capsulitis of left shoulder   . Chronic left shoulder pain   . DDD (degenerative disc disease), lumbar   . GERD (gastroesophageal reflux disease)   . Tendinosis of left shoulder   . Thoracic spine pain   . Trochanteric  bursitis of left hip     Family History  Problem Relation Age of Onset  . Cancer Mother   . COPD Mother   . Diabetes Mother   . Heart disease Mother   . Diabetes Maternal Grandmother   . Heart disease Maternal Grandmother   . Rheum arthritis Maternal Grandmother   . Cancer Maternal Grandfather    Past Surgical History:  Procedure Laterality Date  . c sections    . CHOLECYSTECTOMY    . COLONOSCOPY    . DILITATION & CURRETTAGE/HYSTROSCOPY WITH NOVASURE ABLATION N/A 02/05/2014   Procedure: DILATATION & CURETTAGE/HYSTEROSCOPY WITH NOVASURE ABLATION;  Surgeon: Antionette Char, MD;  Location: [facility];  Service: Gynecology;  Laterality: N/A;  . FOOT SURGERY    . IUD REMOVAL  07/2017  . SHOULDER SURGERY Left 07/2017   for adhesive capsulitis; Dr. Scherrie Gerlach @ [facility]  . TOTAL SHOULDER ARTHROPLASTY    . TUBAL LIGATION     Social History   Social History Narrative   Lives at home with her son (he lives with her to help her)   Right handed   Drinks no caffeine     Objective: Vital Signs: BP 125/78 (BP Location: Left Arm, Patient Position: Sitting, Cuff Size: Normal)   Pulse 65   Resp 16   Ht  (1.727 m)   Wt 238 lb (108 kg)   LMP 09/19/2017   BMI 36.19 kg/m    Physical Exam  Constitutional: She is oriented to person, place, and time. She appears well-developed and well-nourished.  HENT:  Head: Normocephalic and atraumatic.  Eyes: Conjunctivae and EOM are normal.  Neck: Normal range of motion.  Cardiovascular: Normal rate, regular rhythm, normal heart sounds and intact distal pulses.  Pulmonary/Chest: Effort normal and breath sounds normal.  Abdominal: Soft. Bowel sounds are normal.  Lymphadenopathy:    She has no cervical adenopathy.  Neurological: She is alert and oriented to person, place, and time.  Skin: Skin is warm and dry. Capillary refill takes less than 2 seconds.  Psychiatric: She has a normal mood and affect. Her behavior is normal.  Nursing  note and vitals reviewed.    Musculoskeletal Exam: C-spine limited range of motion with discomfort.  Lumbar spine limited range of motion with discomfort.  Left shoulder joint limited range of motion.  Elbow joints wrist joint MCPs PIPs DIPs were in good range of motion with no synovitis.  Hip joints knee joints ankles MTPs PIPs were in good range of motion with no swelling.  She has discomfort range of motion of her right knee joint.  CDAI Exam: No CDAI exam completed.    Investigation: Findings:  MRI:L2-L3 small broad R paracentral to lateral discprotrustion w/o mass effect in nerve roots, L3-L4 mild disc bulge moderate facet arthrosis, and redundant ligamentum flavum resulting in mild canal stenosis, L4-L5 mild disc bulge to moderate facet arthrosis MRI of cervical spine showed no evidence of herniated disc or compression fracture  08/15/17: ANA positive, dsDNA 1, RNP 2.5, Smith negative, Ro negative, La negative, TSH 2.52, HIV non-reactive, RPR non-reactive, Hep C antibody negative, RF <10, Heavy metals panel WNL, vitamin B6 7.3, SPEP WNL, IFE WNL, Vitamin B1 WNL, Methylmalonic acid WNL  CBC Latest  Ref Rng & Units 02/02/2014  WBC 4.0 - 10.5 K/uL 8.8  Hemoglobin 12.0 - 15.0 g/dL 11.8(L)  Hematocrit 36.0 - 46.0 % 36.4  Platelets 150 - 400 K/uL 300   CMP Latest Ref Rng & Units 08/08/2017  Total Protein 6.0 - 8.5 g/dL 6.7      Imaging: Xr Knee 3 View Right  Result Date: 10/17/2017 No medial lateral compartment narrowing was noted.  No chondrocalcinosis was noted.  No patellofemoral narrowing was noted. Impression: Unremarkable x-ray of the knee joint.   Speciality Comments: No specialty comments available.    Procedures:  No procedures performed Allergies: Percocet [oxycodone-acetaminophen]   Assessment / Plan:     Visit Diagnoses: Positive ANA (antinuclear antibody) - ANA positive, RNP 2.5.  Patient clinically does not have any features of autoimmune disease.  There is no  family history of autoimmune disease.  She gives history of intermittent joint swelling.  I do not see any synovitis on examination today.  I have advised her to contact me in case she develops  joint swelling.  I would like to obtain [facility] labs 3 months from now.  Chronic pain of right knee -no warmth swelling or effusion was noted.  Plan: XR KNEE 3 VIEW RIGHT.  X-ray of the knee joint was unremarkable.  Handout on knee exercises was given.  Have given her prescription for Voltaren gel that can be used topically.  Side effects were reviewed.  Chronic pain syndrome-she continues to have generalized pain.  Chronic neck pain-she has been seeing a chiropractor for chronic neck pain.  Patient states the MRI of the C-spine was unremarkable.  Trochanteric bursitis, left hip-she has chronic pain in her left trochanter area although she did not have tenderness on examination today.  Impingement syndrome of left shoulder - She has been to a [facility] at [facility].  She has limited range of motion of her left shoulder.  DDD (degenerative disc disease), lumbar - MRI:L2-L3 small broad R paracentral to lateral discprotrustion w/o mass effect in nerve roots, L3-L4 mild disc bulge moderate facetarthrosis,mild canal stenosis.  According to patient she is scheduled for lumbar spine surgery next month.  SI (sacroiliac) joint dysfunction-chronic pain.  Vestibular migraine  History of gastroesophageal reflux (GERD)      Orders: Orders Placed This Encounter  Procedures  . XR KNEE 3 VIEW RIGHT   Meds ordered this encounter  Medications  . diclofenac sodium (VOLTAREN) 1 % GEL    Sig: Apply 3 gm to 3 large joints up to 3 times a day.Dispense 3 tubes with 3 refills.    Dispense:  3 Tube    Refill:  0    Face-to-face time spent with patient was 50 minutes. >50% of time was spent in counseling and coordination of care.  Follow-Up Instructions: Return in about 4 months (around 02/16/2018)  for Positive ANA, positive RNP.   Pollyann Savoy, MD  Note - This record has been created using Animal nutritionist.  Chart creation errors have been sought, but may not always  have been located. Such creation errors do not reflect on  the standard of medical care.

## 2017-10-17 ENCOUNTER — Ambulatory Visit: Payer: BLUE CROSS/BLUE SHIELD | Admitting: Rheumatology

## 2017-10-17 ENCOUNTER — Encounter: Payer: Self-pay | Admitting: Rheumatology

## 2017-10-17 ENCOUNTER — Ambulatory Visit (INDEPENDENT_AMBULATORY_CARE_PROVIDER_SITE_OTHER): Payer: Self-pay

## 2017-10-17 VITALS — BP 125/78 | HR 65 | Resp 16 | Ht 68.0 in | Wt 238.0 lb

## 2017-10-17 DIAGNOSIS — R768 Other specified abnormal immunological findings in serum: Secondary | ICD-10-CM

## 2017-10-17 DIAGNOSIS — M5136 Other intervertebral disc degeneration, lumbar region: Secondary | ICD-10-CM | POA: Insufficient documentation

## 2017-10-17 DIAGNOSIS — Z8719 Personal history of other diseases of the digestive system: Secondary | ICD-10-CM

## 2017-10-17 DIAGNOSIS — M7542 Impingement syndrome of left shoulder: Secondary | ICD-10-CM | POA: Insufficient documentation

## 2017-10-17 DIAGNOSIS — G894 Chronic pain syndrome: Secondary | ICD-10-CM | POA: Insufficient documentation

## 2017-10-17 DIAGNOSIS — G8929 Other chronic pain: Secondary | ICD-10-CM

## 2017-10-17 DIAGNOSIS — M25561 Pain in right knee: Secondary | ICD-10-CM

## 2017-10-17 DIAGNOSIS — G43109 Migraine with aura, not intractable, without status migrainosus: Secondary | ICD-10-CM

## 2017-10-17 DIAGNOSIS — M7062 Trochanteric bursitis, left hip: Secondary | ICD-10-CM

## 2017-10-17 DIAGNOSIS — M542 Cervicalgia: Secondary | ICD-10-CM

## 2017-10-17 DIAGNOSIS — M533 Sacrococcygeal disorders, not elsewhere classified: Secondary | ICD-10-CM | POA: Insufficient documentation

## 2017-10-17 DIAGNOSIS — G43809 Other migraine, not intractable, without status migrainosus: Secondary | ICD-10-CM | POA: Insufficient documentation

## 2017-10-17 MED ORDER — DICLOFENAC SODIUM 1 % TD GEL: TRANSDERMAL | 0 refills | Status: AC

## 2017-10-17 NOTE — Patient Instructions (Signed)

## 2017-10-31 ENCOUNTER — Telehealth: Payer: Self-pay | Admitting: Rheumatology

## 2017-10-31 NOTE — Telephone Encounter (Signed)
[facility] authorization request for Diclofenac Sodium 1% Gel.  We did receive the authorization, but it was not on the correct form so we have a couple of questions.  Please call # [phone]  Ref [id number]

## 2017-11-01 NOTE — Telephone Encounter (Signed)
Spoke with representative and she submitted claim. Waiting on approval

## 2017-11-14 ENCOUNTER — Telehealth (INDEPENDENT_AMBULATORY_CARE_PROVIDER_SITE_OTHER): Payer: Self-pay | Admitting: Radiology

## 2017-11-14 NOTE — Telephone Encounter (Signed)
iclofenac 1% approved

## 2017-11-27 ENCOUNTER — Ambulatory Visit: Payer: BLUE CROSS/BLUE SHIELD | Admitting: Rheumatology

## 2018-01-09 ENCOUNTER — Telehealth: Payer: Self-pay | Admitting: Rheumatology

## 2018-01-09 NOTE — Telephone Encounter (Signed)
Patient states [facility] does not have her insurance info, and will not release results to Dr. Corliss Skains until they get it. Patient is out of town for her sisters funeral, and does not have insurance card. Patient would like to know if we can send the info to the lab for her. Please call with questions, or problems.

## 2018-01-09 NOTE — Telephone Encounter (Signed)
A copy of insurance card and demographic information has been faxed to [facility]. Attempted to call patient and left message on machine to notify patient.

## 2018-02-19 DIAGNOSIS — Z0289 Encounter for other administrative examinations: Secondary | ICD-10-CM

## 2018-02-25 NOTE — Progress Notes (Signed)
Office Visit Note  Patient: Renee Lane             Date of Birth: 1960-05-15           MRN: [id number]             PCP: Darryl Lent, PA-C Referring: Darryl Lent, PA-C Visit Date: 02/27/2018 Occupation: @GUAROCC @  Subjective:  Stiffness.   History of Present Illness: Renee Lane is a 57 y.o. female with history of positive ANA and disc disease.  She states she had lumbar spine fusion in July 2019.  She is doing much better as regards to lower back pain.  She is going to physical therapy.  Her neck pain is better as well.  She needs left carpal tunnel release and left ulnar nerve compression release in future.  Left shoulder is doing better.  Activities of Daily Living:  Patient reports morning stiffness for 3 hours.   Patient Denies nocturnal pain.  Difficulty dressing/grooming: Reports Difficulty climbing stairs: Reports Difficulty getting out of chair: Reports Difficulty using hands for taps, buttons, cutlery, and/or writing: Reports  Review of Systems  Constitutional: Positive for fatigue. Negative for night sweats, weight gain and weight loss.  HENT: Negative for mouth sores, trouble swallowing, trouble swallowing, mouth dryness and nose dryness.   Eyes: Negative for pain, redness, visual disturbance and dryness.  Respiratory: Negative for cough, shortness of breath and difficulty breathing.   Cardiovascular: Negative for chest pain, palpitations, hypertension, irregular heartbeat and swelling in legs/feet.  Gastrointestinal: Negative for blood in stool, constipation and diarrhea.  Endocrine: Negative for increased urination.  Genitourinary: Negative for difficulty urinating and vaginal dryness.  Musculoskeletal: Positive for arthralgias, joint pain and morning stiffness. Negative for gait problem, joint swelling, myalgias, muscle weakness, muscle tenderness and myalgias.  Skin: Negative for color change, rash, hair loss, skin tightness, ulcers and  sensitivity to sunlight.  Allergic/Immunologic: Negative for susceptible to infections.  Neurological: Positive for numbness. Negative for dizziness, memory loss, night sweats and weakness.  Hematological: Negative for swollen glands.  Psychiatric/Behavioral: Positive for sleep disturbance. Negative for depressed mood. The patient is not nervous/anxious.     PMFS History:  Patient Active Problem List   Diagnosis Date Noted  . Vestibular migraine 10/17/2017  . SI (sacroiliac) joint dysfunction 10/17/2017  . DDD (degenerative disc disease), lumbar 10/17/2017  . Impingement syndrome of left shoulder 10/17/2017  . Chronic pain syndrome 10/17/2017  . History of gastroesophageal reflux (GERD) 10/17/2017  . Chronic neck and back pain 08/08/2017  . Abnormal uterine bleeding 03/10/2014    Past Medical History:  Diagnosis Date  . Adhesive capsulitis of left shoulder   . Chronic left shoulder pain   . DDD (degenerative disc disease), lumbar   . GERD (gastroesophageal reflux disease)   . Tendinosis of left shoulder   . Thoracic spine pain   . Trochanteric bursitis of left hip     Family History  Problem Relation Age of Onset  . Cancer Mother   . COPD Mother   . Diabetes Mother   . Heart disease Mother   . Diabetes Maternal Grandmother   . Heart disease Maternal Grandmother   . Rheum arthritis Maternal Grandmother   . Cancer Maternal Grandfather    Past Surgical History:  Procedure Laterality Date  . c sections    . CESAREAN SECTION    . CHOLECYSTECTOMY    . COLONOSCOPY    . DILITATION & CURRETTAGE/HYSTROSCOPY WITH NOVASURE ABLATION N/A 02/05/2014  Procedure: DILATATION & CURETTAGE/HYSTEROSCOPY WITH NOVASURE ABLATION;  Surgeon: Antionette Char, MD;  Location: [facility];  Service: Gynecology;  Laterality: N/A;  . FOOT SURGERY    . IUD REMOVAL  07/2017  . LUMBAR SPINE SURGERY    . SHOULDER SURGERY Left 07/2017   for adhesive capsulitis; Dr. Scherrie Gerlach @ [facility]  .  TOTAL SHOULDER ARTHROPLASTY    . TUBAL LIGATION     Social History   Social History Narrative   Lives at home with her son (he lives with her to help her)   Right handed   Drinks no caffeine    Objective: Vital Signs: BP 124/75 (BP Location: Left Arm, Patient Position: Sitting, Cuff Size: Normal)   Pulse 65   Resp 16   Ht 5' 7.5" (1.715 m)   Wt 233 lb 12.8 oz (106.1 kg)   LMP 12/19/2017 (Approximate)   BMI 36.08 kg/m    Physical Exam  Constitutional: She is oriented to person, place, and time. She appears well-developed and well-nourished.  HENT:  Head: Normocephalic and atraumatic.  Eyes: Conjunctivae and EOM are normal.  Neck: Normal range of motion.  Cardiovascular: Normal rate, regular rhythm, normal heart sounds and intact distal pulses.  Pulmonary/Chest: Effort normal and breath sounds normal.  Abdominal: Soft. Bowel sounds are normal.  Lymphadenopathy:    She has no cervical adenopathy.  Neurological: She is alert and oriented to person, place, and time.  Skin: Skin is warm and dry. Capillary refill takes less than 2 seconds.  Psychiatric: She has a normal mood and affect. Her behavior is normal.  Nursing note and vitals reviewed.    Musculoskeletal Exam: C-spine good range of motion.  She has limited painful range of motion of her lumbar spine.  She has good range of motion of bilateral shoulders, bilateral elbows, bilateral wrist joints MCPs PIPs and DIPs.  Hip joints, knee joints, ankles were in good range of motion with no synovitis. CDAI Exam: CDAI Score: Not documented Patient Global Assessment: Not documented; Provider Global Assessment: Not documented Swollen: Not documented; Tender: Not documented Joint Exam   Not documented   There is currently no information documented on the homunculus. Go to the Rheumatology activity and complete the homunculus joint exam.  Investigation: Findings:  January 03, 2018 AVISE -1.8, ANA 1: 80 speckled ENA negative, CB  CAP negative, anticardiolipin negative, beta-2 GP 1-, RF negative, anti-CCP negative, anti-carbamylated protein positive antithyroglobulin negative, antithyroid peroxidase negative   Imaging: No results found.  Recent Labs: Lab Results  Component Value Date   WBC 8.8 02/02/2014   HGB 11.8 (L) 02/02/2014   PLT 300 02/02/2014   PROT 6.7 08/08/2017    Speciality Comments: No specialty comments available.  Procedures:  No procedures performed Allergies: Percocet [oxycodone-acetaminophen]   Assessment / Plan:     Visit Diagnoses: Positive ANA (antinuclear antibody) - AVISE index -1.8, ANA 1: 80 speckled.  I had detailed discussion regarding AVISE labs with the patient.  Her ANA is low titer and not significant.  She has no clinical features of autoimmune disease at this point.  Positive anti-carbamylated protein and low titer rheumatoid factor-patient has no synovitis on examination.  Significance of both antibodies were discussed.  I have advised her to contact me in case she develops any increased joint swelling or pain.  Chronic pain syndrome-she is on medications.  Neck pain - MRI of C-spine was unremarkable.  Patient has been followed by chiropractor.  DDD (degenerative disc disease), lumbar - s/p  fusion L2-L4 07/19.  She is going to physical therapy and lower back pain is improving.  Weight loss diet and exercise was discussed.  Impingement syndrome of left shoulder-she is currently not having much discomfort in her shoulder.  Other medical problems are listed as follows:  SI (sacroiliac) joint dysfunction  Vestibular migraine  History of gastroesophageal reflux (GERD)   Orders: No orders of the defined types were placed in this encounter.  No orders of the defined types were placed in this encounter.  .  Follow-Up Instructions: Return if symptoms worsen or fail to improve, for Positive ANA.   Pollyann Savoy, MD  Note - This record has been created using  Animal nutritionist.  Chart creation errors have been sought, but may not always  have been located. Such creation errors do not reflect on  the standard of medical care.

## 2018-02-27 ENCOUNTER — Ambulatory Visit (INDEPENDENT_AMBULATORY_CARE_PROVIDER_SITE_OTHER): Payer: BLUE CROSS/BLUE SHIELD | Admitting: Rheumatology

## 2018-02-27 ENCOUNTER — Encounter: Payer: Self-pay | Admitting: Rheumatology

## 2018-02-27 ENCOUNTER — Encounter (INDEPENDENT_AMBULATORY_CARE_PROVIDER_SITE_OTHER): Payer: Self-pay

## 2018-02-27 VITALS — BP 124/75 | HR 65 | Resp 16 | Ht 67.5 in | Wt 233.8 lb

## 2018-02-27 DIAGNOSIS — M542 Cervicalgia: Secondary | ICD-10-CM

## 2018-02-27 DIAGNOSIS — M5136 Other intervertebral disc degeneration, lumbar region: Secondary | ICD-10-CM | POA: Diagnosis not present

## 2018-02-27 DIAGNOSIS — M7542 Impingement syndrome of left shoulder: Secondary | ICD-10-CM

## 2018-02-27 DIAGNOSIS — G43109 Migraine with aura, not intractable, without status migrainosus: Secondary | ICD-10-CM

## 2018-02-27 DIAGNOSIS — R768 Other specified abnormal immunological findings in serum: Secondary | ICD-10-CM | POA: Diagnosis not present

## 2018-02-27 DIAGNOSIS — M533 Sacrococcygeal disorders, not elsewhere classified: Secondary | ICD-10-CM

## 2018-02-27 DIAGNOSIS — G894 Chronic pain syndrome: Secondary | ICD-10-CM

## 2018-02-27 DIAGNOSIS — G43809 Other migraine, not intractable, without status migrainosus: Secondary | ICD-10-CM

## 2018-02-27 DIAGNOSIS — Z8719 Personal history of other diseases of the digestive system: Secondary | ICD-10-CM

## 2018-11-20 ENCOUNTER — Telehealth: Payer: Self-pay | Admitting: Plastic Surgery

## 2018-11-20 NOTE — Telephone Encounter (Signed)

## 2018-11-21 ENCOUNTER — Encounter: Payer: Self-pay | Admitting: Plastic Surgery

## 2018-11-21 ENCOUNTER — Ambulatory Visit (INDEPENDENT_AMBULATORY_CARE_PROVIDER_SITE_OTHER): Payer: Medicaid Other | Admitting: Plastic Surgery

## 2018-11-21 ENCOUNTER — Other Ambulatory Visit: Payer: Self-pay

## 2018-11-21 VITALS — BP 124/82 | HR 70 | Temp 98.2°F | Ht 67.0 in | Wt 233.2 lb

## 2018-11-21 DIAGNOSIS — M549 Dorsalgia, unspecified: Secondary | ICD-10-CM | POA: Insufficient documentation

## 2018-11-21 DIAGNOSIS — M546 Pain in thoracic spine: Secondary | ICD-10-CM

## 2018-11-21 DIAGNOSIS — M542 Cervicalgia: Secondary | ICD-10-CM | POA: Diagnosis not present

## 2018-11-21 DIAGNOSIS — M7542 Impingement syndrome of left shoulder: Secondary | ICD-10-CM

## 2018-11-21 DIAGNOSIS — G894 Chronic pain syndrome: Secondary | ICD-10-CM | POA: Diagnosis not present

## 2018-11-21 DIAGNOSIS — G8929 Other chronic pain: Secondary | ICD-10-CM

## 2018-11-21 DIAGNOSIS — N62 Hypertrophy of breast: Secondary | ICD-10-CM

## 2018-11-21 NOTE — Progress Notes (Signed)
Patient ID: Renee Lane, female    DOB: 06/25/1960, 58 y.o.   MRN: [id number]   Chief Complaint  Patient presents with  . Advice Only    for (B) breast reduction  . Breast Problem    Mammary Hyperplasia: The patient is a 58 y.o. female with a history of mammary hyperplasia for several years.  She has extremely large breasts causing symptoms that include the following: Back pain (upper and lower) and neck pain. She frequently pins bra cups higher on straps for better lift and relief. Notices relief when holding breast up in her hands. Shoulder straps causing grooves, pain occasionally requiring padding. Pain medication is sometimes required with motrin and tylenol.  Activities that are hindered by enlarged breasts include: Exercise and running.  She has had a spinal fusion due to the excess pain and had some relief with that but still remains with the excess weight from the breast.  Her breasts are extremely large and fairly symmetric.  She has hyperpigmentation of the inframammary area on both sides.  The sternal to nipple distance on the right is 35 cm and the left is 35 cm.  The IMF distance is 13 cm.  She is 5 feet 7 inches tall and weighs 233 pounds.  Preoperative bra size = 40DDD but wearing a DD cup.  The estimated excess breast tissue to be removed at the time of surgery = 950 grams on the left and 950 grams on the right.  Mammogram history: November 2019 and it was negative.  She has a cousin with a history of breast cancer.  She is not for sure what size she wants to be but definitely smaller.  When I recommended a CT she seemed to be in agreement.    Review of Systems  Constitutional: Positive for activity change. Negative for appetite change.  HENT: Negative.   Eyes: Negative.   Respiratory: Negative.  Negative for chest tightness and shortness of breath.   Cardiovascular: Negative for leg swelling.  Gastrointestinal: Negative for abdominal pain.  Endocrine: Negative.    Genitourinary: Negative.   Musculoskeletal: Positive for arthralgias, back pain and neck pain.  Skin: Negative for color change and wound.  Psychiatric/Behavioral: Negative.     Past Medical History:  Diagnosis Date  . Adhesive capsulitis of left shoulder   . Chronic left shoulder pain   . DDD (degenerative disc disease), lumbar   . GERD (gastroesophageal reflux disease)   . Tendinosis of left shoulder   . Thoracic spine pain   . Trochanteric bursitis of left hip     Past Surgical History:  Procedure Laterality Date  . c sections    . CESAREAN SECTION    . CHOLECYSTECTOMY    . COLONOSCOPY    . DILITATION & CURRETTAGE/HYSTROSCOPY WITH NOVASURE ABLATION N/A 02/05/2014   Procedure: DILATATION & CURETTAGE/HYSTEROSCOPY WITH NOVASURE ABLATION;  Surgeon: Lahoma Crocker, MD;  Location: [facility];  Service: Gynecology;  Laterality: N/A;  . FOOT SURGERY    . IUD REMOVAL  07/2017  . LUMBAR SPINE SURGERY    . SHOULDER SURGERY Left 07/2017   for adhesive capsulitis; Dr. Vernon Prey @ [facility]  . TOTAL SHOULDER ARTHROPLASTY    . TUBAL LIGATION        Current Outpatient Medications:  .  acetaminophen (TYLENOL) 325 MG tablet, Take by mouth., Disp: , Rfl:  .  aspirin-acetaminophen-caffeine (EXCEDRIN MIGRAINE) 160-109-32 MG tablet, Take by mouth as needed., Disp: , Rfl:  .  cetirizine (ZYRTEC) 10 MG tablet, Take by mouth as needed., Disp: , Rfl:  .  Cyanocobalamin (VITAMIN B 12 PO), Take 1 tablet by mouth daily., Disp: , Rfl:  .  diclofenac sodium (VOLTAREN) 1 % GEL, Apply 3 gm to 3 large joints up to 3 times a day.Dispense 3 tubes with 3 refills., Disp: 3 Tube, Rfl: 0 .  esomeprazole (NEXIUM) 20 MG capsule, Take 20 mg by mouth daily at 12 noon., Disp: , Rfl:  .  gabapentin (NEURONTIN) 100 MG capsule, as needed., Disp: , Rfl:  .  loratadine (CLARITIN) 10 MG tablet, Take 10 mg by mouth daily as needed for allergies., Disp: , Rfl:  .  methocarbamol (ROBAXIN) 500 MG tablet, TAKE ONE  TABLET BY MOUTH 4 TIMES DAILY FOR 30 DAYS, Disp: , Rfl: 0 .  ondansetron (ZOFRAN ODT) 4 MG disintegrating tablet, Take 1 tablet (4 mg total) by mouth every 8 (eight) hours as needed for nausea or vomiting., Disp: 20 tablet, Rfl: 0 .  TRINTELLIX 5 MG TABS tablet, Take 5mg  tablet by mouth as needed., Disp: , Rfl: 0   Objective:   Vitals:   11/21/18 1140  BP: 124/82  Pulse: 70  Temp: 98.2 F (36.8 C)  SpO2: 97%    Physical Exam Vitals signs and nursing note reviewed.  Constitutional:      Appearance: Normal appearance.  HENT:     Head: Normocephalic and atraumatic.     Right Ear: External ear normal.     Left Ear: External ear normal.     Nose: Nose normal.  Cardiovascular:     Rate and Rhythm: Normal rate.     Pulses: Normal pulses.  Pulmonary:     Effort: Pulmonary effort is normal. No respiratory distress.  Abdominal:     General: Abdomen is flat. There is no distension.     Tenderness: There is no abdominal tenderness.  Skin:    General: Skin is warm.  Neurological:     General: No focal deficit present.     Mental Status: She is alert. Mental status is at baseline.  Psychiatric:        Mood and Affect: Mood normal.        Behavior: Behavior normal.        Thought Content: Thought content normal.     Assessment & Plan:  Chronic pain syndrome  Impingement syndrome of left shoulder  Neck pain  Chronic bilateral thoracic back pain  Symptomatic mammary hypertrophy  Recommend bilateral breast reduction.  We discussed the scars.  Surgery would be about 4 hours.  She could go home the same day.  She would need to take it easy for about a week.  She can then get back out and start walking. We need the mammogram report from November 2019  Pictures were obtained of the patient and placed in the chart with the patient's or guardian's permission.   Alena Billslaire S Dillingham, DO

## 2018-12-04 ENCOUNTER — Telehealth: Payer: Self-pay | Admitting: Plastic Surgery

## 2018-12-04 NOTE — Telephone Encounter (Signed)
LVM for patient to return call to the office. Insurance has denied the surgery request.

## 2020-01-22 ENCOUNTER — Other Ambulatory Visit: Payer: Self-pay

## 2020-01-22 ENCOUNTER — Ambulatory Visit (INDEPENDENT_AMBULATORY_CARE_PROVIDER_SITE_OTHER): Payer: Medicare Other | Admitting: Plastic Surgery

## 2020-01-22 ENCOUNTER — Encounter: Payer: Self-pay | Admitting: Plastic Surgery

## 2020-01-22 VITALS — HR 67 | Temp 98.5°F | Ht 67.0 in | Wt 210.0 lb

## 2020-01-22 DIAGNOSIS — M549 Dorsalgia, unspecified: Secondary | ICD-10-CM | POA: Diagnosis not present

## 2020-01-22 DIAGNOSIS — M542 Cervicalgia: Secondary | ICD-10-CM | POA: Diagnosis not present

## 2020-01-22 DIAGNOSIS — G8929 Other chronic pain: Secondary | ICD-10-CM

## 2020-01-22 DIAGNOSIS — N62 Hypertrophy of breast: Secondary | ICD-10-CM

## 2020-01-22 DIAGNOSIS — M546 Pain in thoracic spine: Secondary | ICD-10-CM

## 2020-01-22 NOTE — Progress Notes (Signed)
Subjective:    Patient ID: Renee Lane, female    DOB: Feb 04, 1961, 59 y.o.   MRN: [id number]  Mammary Hyperplasia: The patient is a 59 y.o. female for a follow-up for mammary hyperplasia for several years.  She was seen in the past and has since been through physical therapy.  She has not had any improvement after the therapy or with time and her symptoms.  She has extremely large breasts causing symptoms that include the following: Back pain in the upper and lower back, including neck pain. She pulls or pins her bra straps to provide better lift and relief of the pressure and pain. She notices relief by holding her breast up manually.  Her shoulder straps cause grooves and pain and pressure that requires padding for relief. Pain medication is sometimes required with motrin and tylenol.  Activities that are hindered by enlarged breasts include: exercise and running.  She has tried supportive clothing as well as fitted bras without improvement.  She had spine surgery in 2019.  Her breasts are extremely large and fairly symmetric.  She has hyperpigmentation of the inframammary area on both sides.  The sternal to nipple distance on the right is 34 cm and the left is 33 cm.  The IMF distance is 12 cm.  She is 5 feet 7 inches tall and weighs 210 pounds.  Preoperative bra size = 40DDD cup.  She has grade 3 ptosis bilaterally.  The estimated excess breast tissue to be removed at the time of surgery = 750 grams on the left and 750 grams on the right.  Mammogram history: November 2020 and it was negative.  Family history of breast cancer: Distant relatives with breast cancer.  Tobacco use: None and no history of diabetes.      Review of Systems  Constitutional: Negative.   HENT: Negative.   Eyes: Negative.   Respiratory: Negative for chest tightness and shortness of breath.   Cardiovascular: Negative for leg swelling.  Gastrointestinal: Negative for abdominal distention and abdominal pain.    Endocrine: Negative.   Genitourinary: Negative.   Musculoskeletal: Positive for back pain and neck pain.  Neurological: Negative.   Hematological: Negative.   Psychiatric/Behavioral: Negative.        Objective:   Physical Exam Vitals and nursing note reviewed.  Constitutional:      Appearance: Normal appearance.  HENT:     Head: Normocephalic and atraumatic.  Cardiovascular:     Rate and Rhythm: Normal rate.     Pulses: Normal pulses.  Pulmonary:     Effort: Pulmonary effort is normal. No respiratory distress.     Breath sounds: No wheezing.  Abdominal:     General: Abdomen is flat. There is no distension.     Tenderness: There is no abdominal tenderness.  Skin:    General: Skin is warm.     Capillary Refill: Capillary refill takes less than 2 seconds.     Coloration: Skin is not jaundiced.     Findings: No bruising.  Neurological:     General: No focal deficit present.     Mental Status: She is alert and oriented to person, place, and time.  Psychiatric:        Mood and Affect: Mood normal.        Behavior: Behavior normal.        Thought Content: Thought content normal.        Assessment & Plan:     ICD-10-CM   1. Chronic  neck and back pain  M54.2    M54.9    G89.29   2. Symptomatic mammary hypertrophy  N62   3. Chronic bilateral thoracic back pain  M54.6    G89.29   4. Neck pain  M54.2     Patient is a very good candidate for bilateral breast reduction with possible liposuction. We reviewed the risks and complications which include bleeding pain and scar.  Decrease in the perianal sensation and potential inability to breast-feed in the future.  This does not increase or decrease her risk of breast cancer.  It is could increase her need for a biopsy if an abnormal mammogram was noted.  We would definitely need a updated mammogram.  She is planning to get that in the next 2 months.  Pictures were obtained of the patient and placed in the chart with the  patient's or guardian's permission.

## 2020-03-01 ENCOUNTER — Other Ambulatory Visit: Payer: Self-pay

## 2020-03-01 ENCOUNTER — Ambulatory Visit (INDEPENDENT_AMBULATORY_CARE_PROVIDER_SITE_OTHER): Payer: Medicare Other | Admitting: Surgical

## 2020-03-01 ENCOUNTER — Encounter: Payer: Self-pay | Admitting: Surgical

## 2020-03-01 VITALS — BP 114/83 | HR 65 | Temp 98.1°F | Ht 67.0 in | Wt 212.0 lb

## 2020-03-01 DIAGNOSIS — G8929 Other chronic pain: Secondary | ICD-10-CM

## 2020-03-01 DIAGNOSIS — M546 Pain in thoracic spine: Secondary | ICD-10-CM

## 2020-03-01 DIAGNOSIS — M542 Cervicalgia: Secondary | ICD-10-CM

## 2020-03-01 DIAGNOSIS — N62 Hypertrophy of breast: Secondary | ICD-10-CM

## 2020-03-01 DIAGNOSIS — M549 Dorsalgia, unspecified: Secondary | ICD-10-CM

## 2020-03-01 NOTE — Progress Notes (Signed)
   Patient ID: Renee Lane, female    DOB: 01/14/1961, 59 y.o.   MRN: [id number]  Chief Complaint  Patient presents with  . Pre-op Exam      ICD-10-CM   1. Chronic neck and back pain  M54.2    M54.9    G89.29   2. Symptomatic mammary hypertrophy  N62   3. Chronic bilateral thoracic back pain  M54.6    G89.29       History of Present Illness: Renee Lane is a 59 y.o.  female  with a history of macromastia.  She presents for preoperative evaluation for upcoming procedure, bilateral breast reduction with liposuction, scheduled for 03/23/2020 with Dr. Dillingham  Patient reports nausea after anesthesia in the past, but no other issues with anesthesia. No history of DVT/PE.  Reports her brother had a DVT after surgery before.  No other family history..  No family or personal history of bleeding or clotting disorders.  Patient is not currently taking any blood thinners.  No history of CVA/MI.  Patient does take chronic Norco 10 for hip and back pain.  She is on a pain contract for this and is planning to use these for postop pain as needed.  She is also planning to use Tylenol ibuprofen postoperatively  Summary of Previous Visit: STN on the right is 34 cm and the left is 33 cm.  IMF distance is 12 cm.  Preoperative bra size is 40 triple D and she has grade 3 ptosis bilaterally.  Estimated excess breast tissue removed the time of surgery 750 g on the left and 750 g on the right.  Patient had a negative mammogram November 2020 which was negative.  She is scheduled for an additional mammogram on November 8.  PMH Significant for: Chronic left shoulder pain, DDD, GERD  Patient is doing well today, has no complaints, no changes in bilateral breast.  Past Medical History: Allergies: Allergies  Allergen Reactions  . Percocet [Oxycodone-Acetaminophen] Rash    Current Medications:  Current Outpatient Medications:  .  acetaminophen (TYLENOL) 325 MG tablet, Take by mouth., Disp: ,  Rfl:  .  aspirin-acetaminophen-caffeine (EXCEDRIN MIGRAINE) 250-250-65 MG tablet, Take by mouth as needed., Disp: , Rfl:  .  cetirizine (ZYRTEC) 10 MG tablet, Take by mouth as needed., Disp: , Rfl:  .  Cyanocobalamin (VITAMIN B 12 PO), Take 1 tablet by mouth daily., Disp: , Rfl:  .  diclofenac sodium (VOLTAREN) 1 % GEL, Apply 3 gm to 3 large joints up to 3 times a day.Dispense 3 tubes with 3 refills., Disp: 3 Tube, Rfl: 0 .  Dulaglutide (TRULICITY) 0.75 MG/0.5ML SOPN, Trulicity 0.75 mg/0.5 mL subcutaneous pen injector  INJECT 1 PEN UNDER THE SKIN EVERY WEEK, Disp: , Rfl:  .  esomeprazole (NEXIUM) 20 MG capsule, Take 20 mg by mouth daily at 12 noon., Disp: , Rfl:  .  gabapentin (NEURONTIN) 100 MG capsule, as needed., Disp: , Rfl:  .  loratadine (CLARITIN) 10 MG tablet, Take 10 mg by mouth daily as needed for allergies., Disp: , Rfl:  .  methocarbamol (ROBAXIN) 500 MG tablet, TAKE ONE TABLET BY MOUTH 4 TIMES DAILY FOR 30 DAYS, Disp: , Rfl: 0 .  ondansetron (ZOFRAN ODT) 4 MG disintegrating tablet, Take 1 tablet (4 mg total) by mouth every 8 (eight) hours as needed for nausea or vomiting., Disp: 20 tablet, Rfl: 0 .  TRINTELLIX 5 MG TABS tablet, Take 5mg tablet by mouth as needed. (Patient not   taking: Reported on 01/22/2020), Disp: , Rfl: 0  Past Medical Problems: Past Medical History:  Diagnosis Date  . Adhesive capsulitis of left shoulder   . Chronic left shoulder pain   . DDD (degenerative disc disease), lumbar   . GERD (gastroesophageal reflux disease)   . Tendinosis of left shoulder   . Thoracic spine pain   . Trochanteric bursitis of left hip     Past Surgical History: Past Surgical History:  Procedure Laterality Date  . c sections    . CESAREAN SECTION    . CHOLECYSTECTOMY    . COLONOSCOPY    . DILITATION & CURRETTAGE/HYSTROSCOPY WITH NOVASURE ABLATION N/A 02/05/2014   Procedure: DILATATION & CURETTAGE/HYSTEROSCOPY WITH NOVASURE ABLATION;  Surgeon: Antionette Char, MD;  Location:  [facility];  Service: Gynecology;  Laterality: N/A;  . FOOT SURGERY    . IUD REMOVAL  07/2017  . LUMBAR SPINE SURGERY    . SHOULDER SURGERY Left 07/2017   for adhesive capsulitis; Dr. Scherrie Gerlach @ [facility]  . TOTAL SHOULDER ARTHROPLASTY    . TUBAL LIGATION      Social History: Social History   Socioeconomic History  . Marital status: Single    Spouse name: Not on file  . Number of children: 4  . Years of education: college certificate   . Highest education level: Some college, no degree  Occupational History  . Not on file  Tobacco Use  . Smoking status: Never Smoker  . Smokeless tobacco: Never Used  Vaping Use  . Vaping Use: Never used  Substance and Sexual Activity  . Alcohol use: No    Alcohol/week: 0.0 standard drinks  . Drug use: No  . Sexual activity: Not Currently    Birth control/protection: Surgical  Other Topics Concern  . Not on file  Social History Narrative   Lives at home with her son (he lives with her to help her)   Right handed   Drinks no caffeine   Social Determinants of Health   Financial Resource Strain:   . Difficulty of Paying Living Expenses: Not on file  Food Insecurity:   . Worried About Programme researcher, broadcasting/film/video in the Last Year: Not on file  . Ran Out of Food in the Last Year: Not on file  Transportation Needs:   . Lack of Transportation (Medical): Not on file  . Lack of Transportation (Non-Medical): Not on file  Physical Activity:   . Days of Exercise per Week: Not on file  . Minutes of Exercise per Session: Not on file  Stress:   . Feeling of Stress : Not on file  Social Connections:   . Frequency of Communication with Friends and Family: Not on file  . Frequency of Social Gatherings with Friends and Family: Not on file  . Attends Religious Services: Not on file  . Active Member of Clubs or Organizations: Not on file  . Attends Banker Meetings: Not on file  . Marital Status: Not on file  Intimate Partner  Violence:   . Fear of Current or Ex-Partner: Not on file  . Emotionally Abused: Not on file  . Physically Abused: Not on file  . Sexually Abused: Not on file    Family History: Family History  Problem Relation Age of Onset  . Cancer Mother   . COPD Mother   . Diabetes Mother   . Heart disease Mother   . Diabetes Maternal Grandmother   . Heart disease Maternal Grandmother   .  Rheum arthritis Maternal Grandmother   . Cancer Maternal Grandfather     Review of Systems: Review of Systems  Constitutional: Negative.   Respiratory: Negative.   Cardiovascular: Negative.   Gastrointestinal: Negative.   Genitourinary: Negative.   Musculoskeletal: Positive for joint pain and myalgias.    Physical Exam: Vital Signs BP 114/83 (BP Location: Left Arm, Patient Position: Sitting, Cuff Size: Large)   Pulse 65   Temp 98.1 F (36.7 C) (Temporal)   Ht 5\' 7"  (1.702 m)   Wt 212 lb (96.2 kg)   LMP 12/19/2017 (Approximate)   SpO2 99%   BMI 33.20 kg/m  Physical Exam Exam conducted with a chaperone present.  Constitutional:      General: She is not in acute distress.    Appearance: Normal appearance. She is not ill-appearing.  HENT:     Head: Normocephalic and atraumatic.  Eyes:     Pupils: Pupils are equal, round Neck:     Musculoskeletal: Normal range of motion.  Cardiovascular:     Rate and Rhythm: Normal rate and regular rhythm.     Pulses: Normal pulses.     Heart sounds: Normal heart sounds. No murmur.  Pulmonary:     Effort: Pulmonary effort is normal. No respiratory distress.     Breath sounds: Normal breath sounds. No wheezing.  Abdominal:     General: Abdomen is flat. There is no distension.     Palpations: Abdomen is soft.     Tenderness: There is no abdominal tenderness.  Musculoskeletal: Normal range of motion.  Skin:    General: Skin is warm and dry.     Findings: No erythema or rash.  Neurological:     General: No focal deficit present.     Mental Status: She  is alert and oriented to person, place, and time. Mental status is at baseline.     Motor: No weakness.  Psychiatric:        Mood and Affect: Mood normal.        Behavior: Behavior normal.    Assessment/Plan: The patient is scheduled for bilateral breast reduction with Dr. 12/21/2017.  Risks, benefits, and alternatives of procedure discussed, questions answered and consent obtained.    Smoking Status: Non-smoker; Counseling Given?  N/A Last Mammogram: November 2020; Results: Negative Scheduled for additional mammogram early November prior to surgery  Caprini Score: 7, high; Risk Factors include: Age, BMI greater than 25, family history of DVT and length of planned surgery. Recommendation for mechanical and pharmacological prophylaxis. Encourage early ambulation.   Pictures obtained:[email or web address]   Post-op Rx sent to pharmacy: Zofran, Keflex.  Patient is on chronic pain management contract and takes Norco 10 as needed.  We discussed supplementing with Tylenol and ibuprofen as needed for postop pain.  Patient was provided with the breast reduction and General Surgical Risk consent document and Pain Medication Agreement prior to their appointment.  They had adequate time to read through the risk consent documents and Pain Medication Agreement. We also discussed them in person together during this preop appointment. All of their questions were answered to their satisfaction.  Recommended calling if they have any further questions.  Risk consent form and Pain Medication Agreement to be scanned into patient's chart.  The risk that can be encountered with breast reduction were discussed and include the following but not limited to these:  Breast asymmetry, fluid accumulation, firmness of the breast, inability to breast feed, loss of nipple or areola, skin loss, decrease or no nipple sensation,  fat necrosis of the breast tissue, bleeding, infection, healing delay.  There are risks of anesthesia, changes to  skin sensation and injury to nerves or blood vessels.  The muscle can be temporarily or permanently injured.  You may have an allergic reaction to tape, suture, glue, blood products which can result in skin discoloration, swelling, pain, skin lesions, poor healing.  Any of these can lead to the need for revisonal surgery or stage procedures.  A reduction has potential to interfere with diagnostic procedures.  Nipple or breast piercing can increase risks of infection.  This procedure is best done when the breast is fully developed.  Changes in the breast will continue to occur over time.  Pregnancy can alter the outcomes of previous breast reduction surgery, weight gain and weigh loss can also effect the long term appearance.   The risks that can be encountered with and after liposuction were discussed and include the following but no limited to these:  Asymmetry, fluid accumulation, firmness of the area, fat necrosis with death of fat tissue, bleeding, infection, delayed healing, anesthesia risks, skin sensation changes, injury to structures including nerves, blood vessels, and muscles which may be temporary or permanent, allergies to tape, suture materials and glues, blood products, topical preparations or injected agents, skin and contour irregularities, skin discoloration and swelling, deep vein thrombosis, cardiac and pulmonary complications, pain, which may persist, persistent pain, recurrence of the lesion, poor healing of the incision, possible need for revisional surgery or staged procedures. Thiere can also be persistent swelling, poor wound healing, rippling or loose skin, worsening of cellulite, swelling, and thermal burn or heat injury from ultrasound with the ultrasound-assisted lipoplasty technique. Any change in weight fluctuations can alter the outcome.   Electronically signed by: Kermit Balo Hadar Elgersma, PA-C 03/01/2020 10:19 AM

## 2020-03-01 NOTE — H&P (View-Only) (Signed)
Patient ID: Renee Lane, female    DOB: 1960/11/17, 59 y.o.   MRN: [id number]  Chief Complaint  Patient presents with  . Pre-op Exam      ICD-10-CM   1. Chronic neck and back pain  M54.2    M54.9    G89.29   2. Symptomatic mammary hypertrophy  N62   3. Chronic bilateral thoracic back pain  M54.6    G89.29       History of Present Illness: Renee ShipMardina L Romanek is a 59 y.o.  female  with a history of macromastia.  She presents for preoperative evaluation for upcoming procedure, bilateral breast reduction with liposuction, scheduled for 03/23/2020 with Dr. Ulice Boldillingham  Patient reports nausea after anesthesia in the past, but no other issues with anesthesia. No history of DVT/PE.  Reports her brother had a DVT after surgery before.  No other family history..  No family or personal history of bleeding or clotting disorders.  Patient is not currently taking any blood thinners.  No history of CVA/MI.  Patient does take chronic Norco 10 for hip and back pain.  She is on a pain contract for this and is planning to use these for postop pain as needed.  She is also planning to use Tylenol ibuprofen postoperatively  Summary of Previous Visit: STN on the right is 34 cm and the left is 33 cm.  IMF distance is 12 cm.  Preoperative bra size is 40 triple D and she has grade 3 ptosis bilaterally.  Estimated excess breast tissue removed the time of surgery 750 g on the left and 750 g on the right.  Patient had a negative mammogram November 2020 which was negative.  She is scheduled for an additional mammogram on November 8.  PMH Significant for: Chronic left shoulder pain, DDD, GERD  Patient is doing well today, has no complaints, no changes in bilateral breast.  Past Medical History: Allergies: Allergies  Allergen Reactions  . Percocet [Oxycodone-Acetaminophen] Rash    Current Medications:  Current Outpatient Medications:  .  acetaminophen (TYLENOL) 325 MG tablet, Take by mouth., Disp: ,  Rfl:  .  aspirin-acetaminophen-caffeine (EXCEDRIN MIGRAINE) 250-250-65 MG tablet, Take by mouth as needed., Disp: , Rfl:  .  cetirizine (ZYRTEC) 10 MG tablet, Take by mouth as needed., Disp: , Rfl:  .  Cyanocobalamin (VITAMIN B 12 PO), Take 1 tablet by mouth daily., Disp: , Rfl:  .  diclofenac sodium (VOLTAREN) 1 % GEL, Apply 3 gm to 3 large joints up to 3 times a day.Dispense 3 tubes with 3 refills., Disp: 3 Tube, Rfl: 0 .  Dulaglutide (TRULICITY) 0.75 MG/0.5ML SOPN, Trulicity 0.75 mg/0.5 mL subcutaneous pen injector  INJECT 1 PEN UNDER THE SKIN EVERY WEEK, Disp: , Rfl:  .  esomeprazole (NEXIUM) 20 MG capsule, Take 20 mg by mouth daily at 12 noon., Disp: , Rfl:  .  gabapentin (NEURONTIN) 100 MG capsule, as needed., Disp: , Rfl:  .  loratadine (CLARITIN) 10 MG tablet, Take 10 mg by mouth daily as needed for allergies., Disp: , Rfl:  .  methocarbamol (ROBAXIN) 500 MG tablet, TAKE ONE TABLET BY MOUTH 4 TIMES DAILY FOR 30 DAYS, Disp: , Rfl: 0 .  ondansetron (ZOFRAN ODT) 4 MG disintegrating tablet, Take 1 tablet (4 mg total) by mouth every 8 (eight) hours as needed for nausea or vomiting., Disp: 20 tablet, Rfl: 0 .  TRINTELLIX 5 MG TABS tablet, Take 5mg  tablet by mouth as needed. (Patient not  taking: Reported on 01/22/2020), Disp: , Rfl: 0  Past Medical Problems: Past Medical History:  Diagnosis Date  . Adhesive capsulitis of left shoulder   . Chronic left shoulder pain   . DDD (degenerative disc disease), lumbar   . GERD (gastroesophageal reflux disease)   . Tendinosis of left shoulder   . Thoracic spine pain   . Trochanteric bursitis of left hip     Past Surgical History: Past Surgical History:  Procedure Laterality Date  . c sections    . CESAREAN SECTION    . CHOLECYSTECTOMY    . COLONOSCOPY    . DILITATION & CURRETTAGE/HYSTROSCOPY WITH NOVASURE ABLATION N/A 02/05/2014   Procedure: DILATATION & CURETTAGE/HYSTEROSCOPY WITH NOVASURE ABLATION;  Surgeon: Antionette Char, MD;  Location:  [facility];  Service: Gynecology;  Laterality: N/A;  . FOOT SURGERY    . IUD REMOVAL  07/2017  . LUMBAR SPINE SURGERY    . SHOULDER SURGERY Left 07/2017   for adhesive capsulitis; Dr. Scherrie Gerlach @ [facility]  . TOTAL SHOULDER ARTHROPLASTY    . TUBAL LIGATION      Social History: Social History   Socioeconomic History  . Marital status: Single    Spouse name: Not on file  . Number of children: 4  . Years of education: college certificate   . Highest education level: Some college, no degree  Occupational History  . Not on file  Tobacco Use  . Smoking status: Never Smoker  . Smokeless tobacco: Never Used  Vaping Use  . Vaping Use: Never used  Substance and Sexual Activity  . Alcohol use: No    Alcohol/week: 0.0 standard drinks  . Drug use: No  . Sexual activity: Not Currently    Birth control/protection: Surgical  Other Topics Concern  . Not on file  Social History Narrative   Lives at home with her son (he lives with her to help her)   Right handed   Drinks no caffeine   Social Determinants of Health   Financial Resource Strain:   . Difficulty of Paying Living Expenses: Not on file  Food Insecurity:   . Worried About Programme researcher, broadcasting/film/video in the Last Year: Not on file  . Ran Out of Food in the Last Year: Not on file  Transportation Needs:   . Lack of Transportation (Medical): Not on file  . Lack of Transportation (Non-Medical): Not on file  Physical Activity:   . Days of Exercise per Week: Not on file  . Minutes of Exercise per Session: Not on file  Stress:   . Feeling of Stress : Not on file  Social Connections:   . Frequency of Communication with Friends and Family: Not on file  . Frequency of Social Gatherings with Friends and Family: Not on file  . Attends Religious Services: Not on file  . Active Member of Clubs or Organizations: Not on file  . Attends Banker Meetings: Not on file  . Marital Status: Not on file  Intimate Partner  Violence:   . Fear of Current or Ex-Partner: Not on file  . Emotionally Abused: Not on file  . Physically Abused: Not on file  . Sexually Abused: Not on file    Family History: Family History  Problem Relation Age of Onset  . Cancer Mother   . COPD Mother   . Diabetes Mother   . Heart disease Mother   . Diabetes Maternal Grandmother   . Heart disease Maternal Grandmother   .  Rheum arthritis Maternal Grandmother   . Cancer Maternal Grandfather     Review of Systems: Review of Systems  Constitutional: Negative.   Respiratory: Negative.   Cardiovascular: Negative.   Gastrointestinal: Negative.   Genitourinary: Negative.   Musculoskeletal: Positive for joint pain and myalgias.    Physical Exam: Vital Signs BP 114/83 (BP Location: Left Arm, Patient Position: Sitting, Cuff Size: Large)   Pulse 65   Temp 98.1 F (36.7 C) (Temporal)   Ht 5\' 7"  (1.702 m)   Wt 212 lb (96.2 kg)   LMP 12/19/2017 (Approximate)   SpO2 99%   BMI 33.20 kg/m  Physical Exam Exam conducted with a chaperone present.  Constitutional:      General: She is not in acute distress.    Appearance: Normal appearance. She is not ill-appearing.  HENT:     Head: Normocephalic and atraumatic.  Eyes:     Pupils: Pupils are equal, round Neck:     Musculoskeletal: Normal range of motion.  Cardiovascular:     Rate and Rhythm: Normal rate and regular rhythm.     Pulses: Normal pulses.     Heart sounds: Normal heart sounds. No murmur.  Pulmonary:     Effort: Pulmonary effort is normal. No respiratory distress.     Breath sounds: Normal breath sounds. No wheezing.  Abdominal:     General: Abdomen is flat. There is no distension.     Palpations: Abdomen is soft.     Tenderness: There is no abdominal tenderness.  Musculoskeletal: Normal range of motion.  Skin:    General: Skin is warm and dry.     Findings: No erythema or rash.  Neurological:     General: No focal deficit present.     Mental Status: She  is alert and oriented to person, place, and time. Mental status is at baseline.     Motor: No weakness.  Psychiatric:        Mood and Affect: Mood normal.        Behavior: Behavior normal.    Assessment/Plan: The patient is scheduled for bilateral breast reduction with Dr. 12/21/2017.  Risks, benefits, and alternatives of procedure discussed, questions answered and consent obtained.    Smoking Status: Non-smoker; Counseling Given?  N/A Last Mammogram: November 2020; Results: Negative Scheduled for additional mammogram early November prior to surgery  Caprini Score: 7, high; Risk Factors include: Age, BMI greater than 25, family history of DVT and length of planned surgery. Recommendation for mechanical and pharmacological prophylaxis. Encourage early ambulation.   Pictures obtained:[email or web address]   Post-op Rx sent to pharmacy: Zofran, Keflex.  Patient is on chronic pain management contract and takes Norco 10 as needed.  We discussed supplementing with Tylenol and ibuprofen as needed for postop pain.  Patient was provided with the breast reduction and General Surgical Risk consent document and Pain Medication Agreement prior to their appointment.  They had adequate time to read through the risk consent documents and Pain Medication Agreement. We also discussed them in person together during this preop appointment. All of their questions were answered to their satisfaction.  Recommended calling if they have any further questions.  Risk consent form and Pain Medication Agreement to be scanned into patient's chart.  The risk that can be encountered with breast reduction were discussed and include the following but not limited to these:  Breast asymmetry, fluid accumulation, firmness of the breast, inability to breast feed, loss of nipple or areola, skin loss, decrease or no nipple sensation,  fat necrosis of the breast tissue, bleeding, infection, healing delay.  There are risks of anesthesia, changes to  skin sensation and injury to nerves or blood vessels.  The muscle can be temporarily or permanently injured.  You may have an allergic reaction to tape, suture, glue, blood products which can result in skin discoloration, swelling, pain, skin lesions, poor healing.  Any of these can lead to the need for revisonal surgery or stage procedures.  A reduction has potential to interfere with diagnostic procedures.  Nipple or breast piercing can increase risks of infection.  This procedure is best done when the breast is fully developed.  Changes in the breast will continue to occur over time.  Pregnancy can alter the outcomes of previous breast reduction surgery, weight gain and weigh loss can also effect the long term appearance.   The risks that can be encountered with and after liposuction were discussed and include the following but no limited to these:  Asymmetry, fluid accumulation, firmness of the area, fat necrosis with death of fat tissue, bleeding, infection, delayed healing, anesthesia risks, skin sensation changes, injury to structures including nerves, blood vessels, and muscles which may be temporary or permanent, allergies to tape, suture materials and glues, blood products, topical preparations or injected agents, skin and contour irregularities, skin discoloration and swelling, deep vein thrombosis, cardiac and pulmonary complications, pain, which may persist, persistent pain, recurrence of the lesion, poor healing of the incision, possible need for revisional surgery or staged procedures. Thiere can also be persistent swelling, poor wound healing, rippling or loose skin, worsening of cellulite, swelling, and thermal burn or heat injury from ultrasound with the ultrasound-assisted lipoplasty technique. Any change in weight fluctuations can alter the outcome.   Electronically signed by: Kermit Balo Orrie Schubert, PA-C 03/01/2020 10:19 AM

## 2020-03-17 ENCOUNTER — Other Ambulatory Visit: Payer: Self-pay

## 2020-03-17 ENCOUNTER — Encounter (HOSPITAL_BASED_OUTPATIENT_CLINIC_OR_DEPARTMENT_OTHER): Payer: Self-pay | Admitting: Plastic Surgery

## 2020-03-19 ENCOUNTER — Other Ambulatory Visit (HOSPITAL_COMMUNITY)
Admission: RE | Admit: 2020-03-19 | Discharge: 2020-03-19 | Disposition: A | Discharge status: Home / Self Care | Payer: Medicare Other | Source: Ambulatory Visit | Attending: Plastic Surgery | Admitting: Plastic Surgery

## 2020-03-19 DIAGNOSIS — Z01812 Encounter for preprocedural laboratory examination: Secondary | ICD-10-CM | POA: Diagnosis present

## 2020-03-19 DIAGNOSIS — Z20822 Contact with and (suspected) exposure to covid-19: Secondary | ICD-10-CM | POA: Insufficient documentation

## 2020-03-19 LAB — SARS CORONAVIRUS 2 (TAT 6-24 HRS): SARS Coronavirus 2: NEGATIVE

## 2020-03-23 ENCOUNTER — Encounter (HOSPITAL_BASED_OUTPATIENT_CLINIC_OR_DEPARTMENT_OTHER)
Admission: RE | Disposition: A | Discharge status: Home / Self Care | Payer: Self-pay | Source: Home / Self Care | Attending: Plastic Surgery

## 2020-03-23 ENCOUNTER — Ambulatory Visit (HOSPITAL_BASED_OUTPATIENT_CLINIC_OR_DEPARTMENT_OTHER): Payer: Medicare Other | Admitting: Anesthesiology

## 2020-03-23 ENCOUNTER — Ambulatory Visit (HOSPITAL_BASED_OUTPATIENT_CLINIC_OR_DEPARTMENT_OTHER)
Admission: RE | Admit: 2020-03-23 | Discharge: 2020-03-23 | Disposition: A | Discharge status: Home / Self Care | Payer: Medicare Other | Attending: Plastic Surgery | Admitting: Plastic Surgery

## 2020-03-23 ENCOUNTER — Other Ambulatory Visit: Payer: Self-pay

## 2020-03-23 ENCOUNTER — Encounter (HOSPITAL_BASED_OUTPATIENT_CLINIC_OR_DEPARTMENT_OTHER): Payer: Self-pay | Admitting: Plastic Surgery

## 2020-03-23 DIAGNOSIS — G8929 Other chronic pain: Secondary | ICD-10-CM | POA: Insufficient documentation

## 2020-03-23 DIAGNOSIS — Z79899 Other long term (current) drug therapy: Secondary | ICD-10-CM | POA: Diagnosis not present

## 2020-03-23 DIAGNOSIS — N62 Hypertrophy of breast: Secondary | ICD-10-CM | POA: Diagnosis present

## 2020-03-23 DIAGNOSIS — M542 Cervicalgia: Secondary | ICD-10-CM | POA: Diagnosis not present

## 2020-03-23 DIAGNOSIS — Z885 Allergy status to narcotic agent status: Secondary | ICD-10-CM | POA: Insufficient documentation

## 2020-03-23 DIAGNOSIS — M546 Pain in thoracic spine: Secondary | ICD-10-CM | POA: Insufficient documentation

## 2020-03-23 HISTORY — PX: BREAST REDUCTION SURGERY: SHX8

## 2020-03-23 HISTORY — DX: Anxiety disorder, unspecified: F41.9

## 2020-03-23 SURGERY — BREAST REDUCTION WITH LIPOSUCTION
Anesthesia: General | Site: Breast | Laterality: Bilateral

## 2020-03-23 MED ORDER — SUCCINYLCHOLINE CHLORIDE 200 MG/10ML IV SOSY
PREFILLED_SYRINGE | INTRAVENOUS | Status: AC
  Filled 2020-03-23: qty 10

## 2020-03-23 MED ORDER — PROPOFOL 10 MG/ML IV BOLUS
INTRAVENOUS | Status: DC | PRN
  Administered 2020-03-23: 150 mg via INTRAVENOUS

## 2020-03-23 MED ORDER — ONDANSETRON HCL 4 MG PO TABS: 4.0000 mg | ORAL_TABLET | Freq: Three times a day (TID) | ORAL | 0 refills | Status: AC | PRN

## 2020-03-23 MED ORDER — FENTANYL CITRATE (PF) 100 MCG/2ML IJ SOLN
INTRAMUSCULAR | Status: DC | PRN
  Administered 2020-03-23: 2 ug via INTRAVENOUS
  Administered 2020-03-23: 100 ug via INTRAVENOUS
  Administered 2020-03-23 (×2): 50 ug via INTRAVENOUS

## 2020-03-23 MED ORDER — LIDOCAINE HCL (CARDIAC) PF 100 MG/5ML IV SOSY
PREFILLED_SYRINGE | INTRAVENOUS | Status: DC | PRN
  Administered 2020-03-23: 100 mg via INTRAVENOUS

## 2020-03-23 MED ORDER — CHLORHEXIDINE GLUCONATE CLOTH 2 % EX PADS: 6.0000 | MEDICATED_PAD | Freq: Once | CUTANEOUS | Status: DC

## 2020-03-23 MED ORDER — LIDOCAINE-EPINEPHRINE 1 %-1:100000 IJ SOLN
INTRAMUSCULAR | Status: DC | PRN
  Administered 2020-03-23: 20 mL

## 2020-03-23 MED ORDER — ROCURONIUM BROMIDE 100 MG/10ML IV SOLN
INTRAVENOUS | Status: DC | PRN
  Administered 2020-03-23: 70 mg via INTRAVENOUS
  Administered 2020-03-23: 10 mg via INTRAVENOUS

## 2020-03-23 MED ORDER — LIDOCAINE 2% (20 MG/ML) 5 ML SYRINGE
INTRAMUSCULAR | Status: AC
  Filled 2020-03-23: qty 5

## 2020-03-23 MED ORDER — DIPHENHYDRAMINE HCL 50 MG/ML IJ SOLN
INTRAMUSCULAR | Status: DC | PRN
  Administered 2020-03-23: 12.5 mg via INTRAVENOUS

## 2020-03-23 MED ORDER — SODIUM CHLORIDE 0.9% FLUSH: 3.0000 mL | Freq: Two times a day (BID) | INTRAVENOUS | Status: DC

## 2020-03-23 MED ORDER — ONDANSETRON HCL 4 MG/2ML IJ SOLN
INTRAMUSCULAR | Status: AC
  Filled 2020-03-23: qty 2

## 2020-03-23 MED ORDER — SUGAMMADEX SODIUM 200 MG/2ML IV SOLN
INTRAVENOUS | Status: DC | PRN
  Administered 2020-03-23: 200 mg via INTRAVENOUS

## 2020-03-23 MED ORDER — CEFAZOLIN SODIUM-DEXTROSE 2-4 GM/100ML-% IV SOLN
INTRAVENOUS | Status: AC
  Filled 2020-03-23: qty 100

## 2020-03-23 MED ORDER — EPINEPHRINE PF 1 MG/ML IJ SOLN
INTRAMUSCULAR | Status: DC | PRN
  Administered 2020-03-23: 1 mg

## 2020-03-23 MED ORDER — SODIUM CHLORIDE 0.9 % IV SOLN: 250.0000 mL | INTRAVENOUS | Status: DC | PRN

## 2020-03-23 MED ORDER — LACTATED RINGERS IV SOLN: INTRAVENOUS | Status: DC

## 2020-03-23 MED ORDER — MIDAZOLAM HCL 2 MG/2ML IJ SOLN
INTRAMUSCULAR | Status: AC
  Filled 2020-03-23: qty 2

## 2020-03-23 MED ORDER — BUPIVACAINE HCL (PF) 0.25 % IJ SOLN
INTRAMUSCULAR | Status: AC
  Filled 2020-03-23: qty 30

## 2020-03-23 MED ORDER — LACTATED RINGERS IV SOLN
INTRAVENOUS | Status: AC | PRN
  Administered 2020-03-23: 400 mL via INTRAVENOUS

## 2020-03-23 MED ORDER — FENTANYL CITRATE (PF) 100 MCG/2ML IJ SOLN
INTRAMUSCULAR | Status: AC
  Filled 2020-03-23: qty 2

## 2020-03-23 MED ORDER — CEFAZOLIN SODIUM-DEXTROSE 2-4 GM/100ML-% IV SOLN
2.0000 g | INTRAVENOUS | Status: AC
  Administered 2020-03-23: 2 g via INTRAVENOUS

## 2020-03-23 MED ORDER — LIDOCAINE HCL (PF) 1 % IJ SOLN
INTRAMUSCULAR | Status: DC | PRN
  Administered 2020-03-23: 50 mL

## 2020-03-23 MED ORDER — ONDANSETRON HCL 4 MG/2ML IJ SOLN: 4.0000 mg | Freq: Once | INTRAMUSCULAR | Status: DC | PRN

## 2020-03-23 MED ORDER — SODIUM CHLORIDE 0.9% FLUSH: 3.0000 mL | INTRAVENOUS | Status: DC | PRN

## 2020-03-23 MED ORDER — CEPHALEXIN 500 MG PO CAPS: 500.0000 mg | ORAL_CAPSULE | Freq: Four times a day (QID) | ORAL | 0 refills | Status: AC

## 2020-03-23 MED ORDER — ROCURONIUM BROMIDE 10 MG/ML (PF) SYRINGE
PREFILLED_SYRINGE | INTRAVENOUS | Status: AC
  Filled 2020-03-23: qty 10

## 2020-03-23 MED ORDER — SUGAMMADEX SODIUM 500 MG/5ML IV SOLN
INTRAVENOUS | Status: AC
  Filled 2020-03-23: qty 5

## 2020-03-23 MED ORDER — DEXAMETHASONE SODIUM PHOSPHATE 10 MG/ML IJ SOLN
INTRAMUSCULAR | Status: AC
  Filled 2020-03-23: qty 1

## 2020-03-23 MED ORDER — DIPHENHYDRAMINE HCL 50 MG/ML IJ SOLN
INTRAMUSCULAR | Status: AC
  Filled 2020-03-23: qty 1

## 2020-03-23 MED ORDER — MIDAZOLAM HCL 5 MG/5ML IJ SOLN
INTRAMUSCULAR | Status: DC | PRN
  Administered 2020-03-23: 2 mg via INTRAVENOUS

## 2020-03-23 MED ORDER — HYDROMORPHONE HCL 1 MG/ML IJ SOLN: 0.2500 mg | INTRAMUSCULAR | Status: DC | PRN

## 2020-03-23 MED ORDER — OXYCODONE HCL 5 MG PO TABS
5.0000 mg | ORAL_TABLET | ORAL | Status: DC | PRN
  Administered 2020-03-23: 5 mg via ORAL

## 2020-03-23 MED ORDER — DEXAMETHASONE SODIUM PHOSPHATE 4 MG/ML IJ SOLN
INTRAMUSCULAR | Status: DC | PRN
  Administered 2020-03-23: 10 mg via INTRAVENOUS

## 2020-03-23 MED ORDER — OXYCODONE HCL 5 MG PO TABS
ORAL_TABLET | ORAL | Status: AC
  Filled 2020-03-23: qty 1

## 2020-03-23 MED ORDER — SCOPOLAMINE 1 MG/3DAYS TD PT72
MEDICATED_PATCH | TRANSDERMAL | Status: AC
  Filled 2020-03-23: qty 1

## 2020-03-23 MED ORDER — LIDOCAINE-EPINEPHRINE 1 %-1:100000 IJ SOLN
INTRAMUSCULAR | Status: AC
  Filled 2020-03-23: qty 1

## 2020-03-23 MED ORDER — SCOPOLAMINE 1 MG/3DAYS TD PT72
1.0000 | MEDICATED_PATCH | TRANSDERMAL | Status: DC
  Administered 2020-03-23: 1.5 mg via TRANSDERMAL

## 2020-03-23 MED ORDER — ACETAMINOPHEN 10 MG/ML IV SOLN: 1000.0000 mg | Freq: Once | INTRAVENOUS | Status: DC | PRN

## 2020-03-23 MED ORDER — FENTANYL CITRATE (PF) 100 MCG/2ML IJ SOLN: 25.0000 ug | INTRAMUSCULAR | Status: DC | PRN

## 2020-03-23 MED ORDER — BUPIVACAINE HCL (PF) 0.25 % IJ SOLN
INTRAMUSCULAR | Status: DC | PRN
  Administered 2020-03-23: 20 mL

## 2020-03-23 SURGICAL SUPPLY — 72 items
ADH SKN CLS APL DERMABOND .7 (GAUZE/BANDAGES/DRESSINGS) ×2
BAG DECANTER FOR FLEXI CONT (MISCELLANEOUS) ×1 IMPLANT
BINDER BREAST LRG (GAUZE/BANDAGES/DRESSINGS) IMPLANT
BINDER BREAST MEDIUM (GAUZE/BANDAGES/DRESSINGS) IMPLANT
BINDER BREAST XLRG (GAUZE/BANDAGES/DRESSINGS) IMPLANT
BINDER BREAST XXLRG (GAUZE/BANDAGES/DRESSINGS) ×2 IMPLANT
BIOPATCH RED 1 DISK 7.0 (GAUZE/BANDAGES/DRESSINGS) IMPLANT
BIOPATCH RED 1IN DISK 7.0MM (GAUZE/BANDAGES/DRESSINGS)
BLADE HEX COATED 2.75 (ELECTRODE) ×3 IMPLANT
BLADE KNIFE PERSONA 10 (BLADE) ×6 IMPLANT
BLADE SURG 15 STRL LF DISP TIS (BLADE) IMPLANT
BLADE SURG 15 STRL SS (BLADE) ×3
BNDG GAUZE ELAST 4 BULKY (GAUZE/BANDAGES/DRESSINGS) IMPLANT
CANISTER SUCT 1200ML W/VALVE (MISCELLANEOUS) ×3 IMPLANT
COVER BACK TABLE 60X90IN (DRAPES) ×3 IMPLANT
COVER MAYO STAND STRL (DRAPES) ×3 IMPLANT
COVER WAND RF STERILE (DRAPES) IMPLANT
DECANTER SPIKE VIAL GLASS SM (MISCELLANEOUS) ×2 IMPLANT
DERMABOND ADVANCED (GAUZE/BANDAGES/DRESSINGS) ×4
DERMABOND ADVANCED .7 DNX12 (GAUZE/BANDAGES/DRESSINGS) IMPLANT
DRAIN CHANNEL 15F RND FF W/TCR (WOUND CARE) ×4 IMPLANT
DRAPE LAPAROSCOPIC ABDOMINAL (DRAPES) ×3 IMPLANT
DRSG OPSITE POSTOP 4X12 (GAUZE/BANDAGES/DRESSINGS) ×2 IMPLANT
DRSG OPSITE POSTOP 4X6 (GAUZE/BANDAGES/DRESSINGS) ×5 IMPLANT
DRSG PAD ABDOMINAL 8X10 ST (GAUZE/BANDAGES/DRESSINGS) ×6 IMPLANT
ELECT BLADE 4.0 EZ CLEAN MEGAD (MISCELLANEOUS)
ELECT REM PT RETURN 9FT ADLT (ELECTROSURGICAL) ×3
ELECTRODE BLDE 4.0 EZ CLN MEGD (MISCELLANEOUS) IMPLANT
ELECTRODE REM PT RTRN 9FT ADLT (ELECTROSURGICAL) ×1 IMPLANT
EVACUATOR SILICONE 100CC (DRAIN) IMPLANT
GLOVE BIO SURGEON STRL SZ 6.5 (GLOVE) ×5 IMPLANT
GLOVE BIO SURGEONS STRL SZ 6.5 (GLOVE) ×3
GLOVE BIOGEL PI IND STRL 7.0 (GLOVE) IMPLANT
GLOVE BIOGEL PI INDICATOR 7.0 (GLOVE) ×4
GOWN STRL REUS W/ TWL LRG LVL3 (GOWN DISPOSABLE) ×2 IMPLANT
GOWN STRL REUS W/TWL LRG LVL3 (GOWN DISPOSABLE) ×15
NDL HYPO 25X1 1.5 SAFETY (NEEDLE) ×1 IMPLANT
NDL SAFETY ECLIPSE 18X1.5 (NEEDLE) IMPLANT
NEEDLE HYPO 18GX1.5 SHARP (NEEDLE) ×3
NEEDLE HYPO 25X1 1.5 SAFETY (NEEDLE) ×3 IMPLANT
NS IRRIG 1000ML POUR BTL (IV SOLUTION) ×3 IMPLANT
PACK BASIN DAY SURGERY FS (CUSTOM PROCEDURE TRAY) ×3 IMPLANT
PAD ALCOHOL SWAB (MISCELLANEOUS) ×2 IMPLANT
PAD FOAM SILICONE BACKED (GAUZE/BANDAGES/DRESSINGS) IMPLANT
PENCIL SMOKE EVACUATOR (MISCELLANEOUS) ×3 IMPLANT
SLEEVE SCD COMPRESS KNEE MED (MISCELLANEOUS) ×3 IMPLANT
SPONGE LAP 18X18 RF (DISPOSABLE) ×6 IMPLANT
STRIP SUTURE WOUND CLOSURE 1/2 (MISCELLANEOUS) ×10 IMPLANT
SUT MNCRL AB 4-0 PS2 18 (SUTURE) ×28 IMPLANT
SUT MON AB 3-0 SH 27 (SUTURE) ×15
SUT MON AB 3-0 SH27 (SUTURE) ×1 IMPLANT
SUT MON AB 5-0 PS2 18 (SUTURE) ×12 IMPLANT
SUT PDS 3-0 CT2 (SUTURE) ×9
SUT PDS AB 2-0 CT2 27 (SUTURE) IMPLANT
SUT PDS II 3-0 CT2 27 ABS (SUTURE) IMPLANT
SUT SILK 3 0 PS 1 (SUTURE) ×4 IMPLANT
SUT VIC AB 3-0 SH 27 (SUTURE)
SUT VIC AB 3-0 SH 27X BRD (SUTURE) IMPLANT
SUT VICRYL 4-0 PS2 18IN ABS (SUTURE) IMPLANT
SYR 3ML 23GX1 SAFETY (SYRINGE) ×1 IMPLANT
SYR 50ML LL SCALE MARK (SYRINGE) IMPLANT
SYR BULB IRRIG 60ML STRL (SYRINGE) ×3 IMPLANT
SYR CONTROL 10ML LL (SYRINGE) ×3 IMPLANT
TAPE MEASURE VINYL STERILE (MISCELLANEOUS) IMPLANT
TOWEL GREEN STERILE FF (TOWEL DISPOSABLE) ×6 IMPLANT
TRAY DSU PREP LF (CUSTOM PROCEDURE TRAY) ×3 IMPLANT
TUBE CONNECTING 20'X1/4 (TUBING) ×1
TUBE CONNECTING 20X1/4 (TUBING) ×2 IMPLANT
TUBING INFILTRATION IT-10001 (TUBING) ×2 IMPLANT
TUBING SET GRADUATE ASPIR 12FT (MISCELLANEOUS) ×2 IMPLANT
UNDERPAD 30X36 HEAVY ABSORB (UNDERPADS AND DIAPERS) ×6 IMPLANT
YANKAUER SUCT BULB TIP NO VENT (SUCTIONS) ×3 IMPLANT

## 2020-03-23 NOTE — Anesthesia Preprocedure Evaluation (Signed)
Anesthesia Evaluation  Patient identified by MRN, date of birth, ID band Patient awake    Reviewed: Allergy & Precautions, NPO status , Patient's Chart, lab work & pertinent test results  Airway Mallampati: II  TM Distance: >3 FB Neck ROM: Full    Dental no notable dental hx.    Pulmonary neg pulmonary ROS,    Pulmonary exam normal breath sounds clear to auscultation       Cardiovascular negative cardio ROS Normal cardiovascular exam Rhythm:Regular Rate:Normal     Neuro/Psych Anxiety negative neurological ROS     GI/Hepatic Neg liver ROS, GERD  Medicated,  Endo/Other  negative endocrine ROS  Renal/GU negative Renal ROS  negative genitourinary   Musculoskeletal negative musculoskeletal ROS (+)   Abdominal   Peds negative pediatric ROS (+)  Hematology negative hematology ROS (+)   Anesthesia Other Findings   Reproductive/Obstetrics negative OB ROS                             Anesthesia Physical Anesthesia Plan  ASA: II  Anesthesia Plan: General   Post-op Pain Management:    Induction: Intravenous  PONV Risk Score and Plan: 4 or greater and Ondansetron, Dexamethasone, Midazolam, Scopolamine patch - Pre-op and Treatment may vary due to age or medical condition  Airway Management Planned: Oral ETT  Additional Equipment:   Intra-op Plan:   Post-operative Plan: Extubation in OR  Informed Consent: I have reviewed the patients History and Physical, chart, labs and discussed the procedure including the risks, benefits and alternatives for the proposed anesthesia with the patient or authorized representative who has indicated his/her understanding and acceptance.     Dental advisory given  Plan Discussed with: CRNA and Surgeon  Anesthesia Plan Comments:         Anesthesia Quick Evaluation

## 2020-03-23 NOTE — Transfer of Care (Signed)
Immediate Anesthesia Transfer of Care Note  Patient: Renee Lane  Procedure(s) Performed: BREAST REDUCTION WITH LIPOSUCTION (Bilateral Breast)  Patient Location: PACU  Anesthesia Type:General  Level of Consciousness: awake, alert , oriented, drowsy and patient cooperative  Airway & Oxygen Therapy: Patient Spontanous Breathing  Post-op Assessment: Report given to RN and Post -op Vital signs reviewed and stable  Post vital signs: Reviewed and stable  Last Vitals:  Vitals Value Taken Time  BP    Temp    Pulse 89 03/23/20 1430  Resp 12 03/23/20 1430  SpO2 100 % 03/23/20 1430  Vitals shown include unvalidated device data.  Last Pain:  Vitals:   03/23/20 1058  TempSrc: Oral  PainSc: 0-No pain         Complications: No complications documented.

## 2020-03-23 NOTE — Op Note (Signed)
Breast Reduction Op note:    DATE OF PROCEDURE: 03/23/2020  LOCATION: [facility]  SURGEON: Alan Ripper Sanger Umaima Scholten, DO  ASSISTANT: Keenan Bachelor, PA  PREOPERATIVE DIAGNOSIS 1. Macromastia 2. Neck Pain 3. Back Pain  POSTOPERATIVE DIAGNOSIS 1. Macromastia 2. Neck Pain 3. Back Pain  PROCEDURES 1. Bilateral breast reduction.  Right reduction 932 g, Left reduction 738 g  COMPLICATIONS: None.  DRAINS:  bilateral  INDICATIONS FOR PROCEDURE Renee Lane is a 59 y.o. year-old female born on [birth year],with a history of symptomatic macromastia with concominant back pain, neck pain, shoulder grooving from her bra.   MRN: [id number]  CONSENT Informed consent was obtained directly from the patient. The risks, benefits and alternatives were fully discussed. Specific risks including but not limited to bleeding, infection, hematoma, seroma, scarring, pain, nipple necrosis, asymmetry, poor cosmetic results, and need for further surgery were discussed. The patient had ample opportunity to have her questions answered to her satisfaction.  DESCRIPTION OF PROCEDURE  Patient was brought into the operating room and placed in a supine position.  SCDs were placed and appropriate padding was performed.  Antibiotics were given. The patient underwent general anesthesia and the chest was prepped and draped in a sterile fashion.  A timeout was performed and all information was confirmed to be correct. Tumescent was placed in the inferior portion of the breast and laterally. Liposuction was done laterally on each breast.   Right side: Preoperative markings were confirmed.  Incision lines were injected with 1% Xylocaine with epinephrine.  After waiting for vasoconstriction, the marked lines were incised.  A Wise-pattern superomedial breast reduction was performed by de-epithelializing the pedicle, using bovie to create the superomedial pedicle, and removing breast tissue from  the lateral and inferior portions of the breast.  Care was taken to not undermine the breast pedicle. Hemostasis was achieved.  The nipple was gently rotated into position and the soft tissue closed with 4-0 Monocryl.   The pocket was irrigated and hemostasis confirmed.  The deep tissues were approximated with 3-0 Monocryl sutures and the skin was closed with deep dermal and subcuticular 4-0 Monocryl sutures.  The nipple and skin flaps had good capillary refill at the end of the procedure.    Left side: Preoperative markings were confirmed.  Incision lines were injected with 1% Xylocaine with epinephrine.  After waiting for vasoconstriction, the marked lines were incised.  A Wise-pattern superomedial breast reduction was performed by de-epithelializing the pedicle, using bovie to create the superomedial pedicle, and removing breast tissue from the lateral and inferior portions of the breast.  Care was taken to not undermine the breast pedicle. Hemostasis was achieved.  The nipple was gently rotated into position and the soft tissue was closed with 4-0 Monocryl.  The patient was sat upright and size and shape symmetry was confirmed.  The pocket was irrigated and hemostasis confirmed.  The deep tissues were approximated with 3-0 Monocryl sutures and the skin was closed with deep dermal and subcuticular 4-0 Monocryl sutures.  Dermabond was applied.  A breast binder and ABDs were placed.  The nipple and skin flaps had good capillary refill at the end of the procedure.  The patient tolerated the procedure well. The patient was allowed to wake from anesthesia and taken to the recovery room in satisfactory condition.  The advanced practice practitioner (APP) assisted throughout the case.  The APP was essential in retraction and counter traction when needed to make the case progress smoothly.  This retraction  and assistance made it possible to see the tissue plans for the procedure.  The assistance was needed for blood  control, tissue re-approximation and assisted with closure of the incision site.

## 2020-03-23 NOTE — Interval H&P Note (Signed)
History and Physical Interval Note:  03/23/2020 10:39 AM  [name]  has presented today for surgery, with the diagnosis of mammary hypertrophy.  The various methods of treatment have been discussed with the patient and family. After consideration of risks, benefits and other options for treatment, the patient has consented to  Procedure(s) with comments: BREAST REDUCTION WITH LIPOSUCTION (Bilateral) - 3 hours, please as a surgical intervention.  The patient's history has been reviewed, patient examined, no change in status, stable for surgery.  I have reviewed the patient's chart and labs.  Questions were answered to the patient's satisfaction.     Renee Lane [name]

## 2020-03-23 NOTE — Anesthesia Procedure Notes (Signed)
Procedure Name: Intubation Date/Time: 03/23/2020 11:38 AM Performed by: Lavonia Dana, CRNA Pre-anesthesia Checklist: Patient identified, Emergency Drugs available, Suction available and Patient being monitored Patient Re-evaluated:Patient Re-evaluated prior to induction Oxygen Delivery Method: Circle system utilized Preoxygenation: Pre-oxygenation with 100% oxygen Induction Type: IV induction Ventilation: Mask ventilation without difficulty Laryngoscope Size: Mac and 3 Grade View: Grade I Tube type: Oral Tube size: 7.0 mm Number of attempts: 1 Airway Equipment and Method: Stylet and Bite block Placement Confirmation: ETT inserted through vocal cords under direct vision,  positive ETCO2 and breath sounds checked- equal and bilateral Secured at: 22 cm Tube secured with: Tape Dental Injury: Teeth and Oropharynx as per pre-operative assessment

## 2020-03-23 NOTE — Discharge Instructions (Signed)
Post Anesthesia Home Care Instructions  Activity: Get plenty of rest for the remainder of the day. A responsible individual must stay with you for 24 hours following the procedure.  For the next 24 hours, DO NOT: -Drive a car -Operate machinery -Drink alcoholic beverages -Take any medication unless instructed by your physician -Make any legal decisions or sign important papers.  Meals: Start with liquid foods such as gelatin or soup. Progress to regular foods as tolerated. Avoid greasy, spicy, heavy foods. If nausea and/or vomiting occur, drink only clear liquids until the nausea and/or vomiting subsides. Call your physician if vomiting continues.  Special Instructions/Symptoms: Your throat may feel dry or sore from the anesthesia or the breathing tube placed in your throat during surgery. If this causes discomfort, gargle with warm salt water. The discomfort should disappear within 24 hours.  If you had a scopolamine patch placed behind your ear for the management of post- operative nausea and/or vomiting:  1. The medication in the patch is effective for 72 hours, after which it should be removed.  Wrap patch in a tissue and discard in the trash. Wash hands thoroughly with soap and water. 2. You may remove the patch earlier than 72 hours if you experience unpleasant side effects which may include dry mouth, dizziness or visual disturbances. 3. Avoid touching the patch. Wash your hands with soap and water after contact with the patch.        INSTRUCTIONS FOR AFTER SURGERY   You will likely have some questions about what to expect following your operation.  The following information will help you and your family understand what to expect when you are discharged from the hospital.  Following these guidelines will help ensure a smooth recovery and reduce risks of complications.  Postoperative instructions include information on: diet, wound care, medications and physical  activity.  AFTER SURGERY Expect to go home after the procedure.  In some cases, you may need to spend one night in the hospital for observation.  DIET This surgery does not require a specific diet.  However, I have to mention that the healthier you eat the better your body can start healing. It is important to increasing your protein intake.  This means limiting the foods with added sugar.  Focus on fruits and vegetables and some meat. It is very important to drink water after your surgery.  If your urine is bright yellow, then it is concentrated, and you need to drink more water.  As a general rule after surgery, you should have 8 ounces of water every hour while awake.  If you find you are persistently nauseated or unable to take in liquids let us know.  NO TOBACCO USE or EXPOSURE.  This will slow your healing process and increase the risk of a wound.  WOUND CARE If you have a drain: Clean with baby wipes for 3 days and then you can shower.  If you have steri-strips / tape directly attached to your skin leave them in place. It is OK to get these wet.  No baths, pools or hot tubs for two weeks. We close your incision to leave the smallest and best-looking scar. No ointment or creams on your incisions until given the go ahead.  Especially not Neosporin (Too many skin reactions with this one).  A few weeks after surgery you can use Mederma and start massaging the scar. We ask you to wear your binder or sports bra for the first 6 weeks around the   clock, including while sleeping. This provides added comfort and helps reduce the fluid accumulation at the surgery site.  ACTIVITY No heavy lifting until cleared by the doctor.  It is OK to walk and climb stairs. In fact, moving your legs is very important to decrease your risk of a blood clot.  It will also help keep you from getting deconditioned.  Every 1 to 2 hours get up and walk for 5 minutes. This will help with a quicker recovery back to normal.  Let  pain be your guide so you don't do too much.  NO, you cannot do the spring cleaning and don't plan on taking care of anyone else.  This is your time for TLC.   WORK Everyone returns to work at different times. As a rough guide, most people take at least 1 - 2 weeks off prior to returning to work. If you need documentation for your job, bring the forms to your postoperative follow up visit.  DRIVING Arrange for someone to bring you home from the hospital.  You may be able to drive a few days after surgery but not while taking any narcotics or valium.  BOWEL MOVEMENTS Constipation can occur after anesthesia and while taking pain medication.  It is important to stay ahead for your comfort.  We recommend taking Milk of Magnesia (2 tablespoons; twice a day) while taking the pain pills.  SEROMA This is fluid your body tried to put in the surgical site.  This is normal but if it creates excessive pain and swelling let us know.  It usually decreases in a few weeks.  MEDICATIONS and PAIN CONTROL At your preoperative visit for you history and physical you were given the following medications: 1. An antibiotic: Start this medication when you get home and take according to the instructions on the bottle. 2. Zofran 4 mg:  This is to treat nausea and vomiting.  You can take this every 6 hours as needed and only if needed. 3. Norco (hydrocodone/acetaminophen) 5/325 mg:  This is only to be used after you have taken the motrin or the tylenol. Every 8 hours as needed. Over the counter Medication to take: 4. Ibuprofen (Motrin) 600 mg:  Take this every 6 hours.  If you have additional pain then take 500 mg of the tylenol.  Only take the Norco after you have tried these two. 5. Miralax or stool softener of choice: Take this according to the bottle if you take the Norco.  WHEN TO CALL Call your surgeon's office if any of the following occur: . Fever 101 degrees F or greater . Excessive bleeding or fluid from the  incision site. . Pain that increases over time without aid from the medications . Redness, warmth, or pus draining from incision sites . Persistent nausea or inability to take in liquids . Severe misshapen area that underwent the operation.  [facility]  What is the benefit of having a drain?  During surgery your tissue layers are separated.  This raw surface stimulates your body to fill the space with serous fluid.  This is normal but you don't want that fluid to collect and prevent healing.  A fluid collection can also become infected.  The Jackson-Pratt (JP) drain is used to eliminate this collection of fluid and allow the tissue to heal together.    Jackson-Pratt (JP) bulb    How to care for your drainage and suction unit at home Your drainage catheter will be connected   to a collection device. The vacuum caused when the device is compressed allows drainage to collect in the device.    . Wash your hands with soap and water before and after touching the system. . Empty the JP drain every 12 hours once you get home from your procedure. . Record the fluid amount on the record sheet included. . Start with stripping the drain tube to push the clots or excess fluid to the bulb.  Do this by pinching the tube with one hand near your skin.  Then with the other hand squeeze the tubing and work it toward the bulb.  This should be done several times a day.  This may collapse the tube which will correct on its own.   . Use a safety pin to attach your collection device to your clothing so there is no tension on the insertion site.   . If you have drainage at the skin insertion site, you can apply a gauze dressing and secure it with tape. . If the drain falls out, apply a gauze dressing over the drain insertion site and secure with tape.   To empty the collection device:   . Release the stopper on the top of the collection unit (bulb).  . Pour contents into a measuring container  such as a plastic medicine cup.  . Record the day and amount of drainage on the attached sheet. . This should be done at least twice a day.    To compress the Jackson-Pratt Bulb:  . Release the stopper at the top of the bulb. . Squeeze the bulb tightly in your fist, squeezing air out of the bulb.  . Replace the stopper while the bulb is compressed.  . Be careful not to spill the contents when squeezing the bulb. . The drainage will start bright red and turn to pink and then yellow with time. . IMPORTANT: If the bulb is not squeezed before adding the stopper it will not draw out the fluid.  Care for the JP drain site and your skin daily:  . You may shower three days after surgery. . Secure the drain to a ribbon or cloth around your waist while showering so it does not pull out while showering. . Be sure your hands are cleaned with soap and water. . Use a clean wet cotton swab to clean the skin around the drain site.  . Use another cotton swab to place Vaseline or antibiotic ointment on the skin around the drain.     Contact your physician if any of the following occur:  . The fluid in the bulb becomes cloudy. . Your temperature is greater than 101.4.  . The incision opens. . If you have drainage at the skin insertion site, you can apply a gauze dressing and secure it with tape. . If the drain falls out, apply a gauze dressing over the drain insertion site and secure with tape.  . You will usually have more drainage when you are active than while you rest or are asleep. If the drainage increases significantly or is bloody call the physician                             Bring this record with you to each office visit Date  Drainage Volume  Date   Drainage volume                                                                                                                                                                                           

## 2020-03-23 NOTE — Anesthesia Postprocedure Evaluation (Signed)
Anesthesia Post Note  Patient: Renee Lane  Procedure(s) Performed: BREAST REDUCTION WITH LIPOSUCTION (Bilateral Breast)     Patient location during evaluation: PACU Anesthesia Type: General Level of consciousness: awake and alert Pain management: pain level controlled Vital Signs Assessment: post-procedure vital signs reviewed and stable Respiratory status: spontaneous breathing, nonlabored ventilation, respiratory function stable and patient connected to nasal cannula oxygen Cardiovascular status: blood pressure returned to baseline and stable Postop Assessment: no apparent nausea or vomiting Anesthetic complications: no   No complications documented.  Last Vitals:  Vitals:   03/23/20 1445 03/23/20 1500  BP: 130/72 127/70  Pulse: 71 82  Resp: 17 12  Temp:    SpO2: 100% 100%    Last Pain:  Vitals:   03/23/20 1500  TempSrc:   PainSc: 0-No pain                 Jenissa Tyrell S

## 2020-03-24 ENCOUNTER — Encounter (HOSPITAL_BASED_OUTPATIENT_CLINIC_OR_DEPARTMENT_OTHER): Payer: Self-pay | Admitting: Plastic Surgery

## 2020-03-24 LAB — SURGICAL PATHOLOGY

## 2020-03-29 ENCOUNTER — Encounter: Payer: Self-pay | Admitting: Plastic Surgery

## 2020-03-29 ENCOUNTER — Other Ambulatory Visit: Payer: Self-pay

## 2020-03-29 ENCOUNTER — Ambulatory Visit (INDEPENDENT_AMBULATORY_CARE_PROVIDER_SITE_OTHER): Payer: Medicare Other | Admitting: Plastic Surgery

## 2020-03-29 VITALS — BP 123/73 | HR 74 | Temp 98.1°F

## 2020-03-29 DIAGNOSIS — N62 Hypertrophy of breast: Secondary | ICD-10-CM

## 2020-03-29 NOTE — Progress Notes (Signed)
59 year old female here for follow-up on her bilateral breast reduction from November 17.  She had 900+ grams removed from the right and 700+ grams removed from the left breast.  Overall she is doing well.  She has bruising and swelling as expected.  The incisions are healing well the dressings are in place.  Pain is well controlled I like to see her back in 2 weeks. The drains were removed.

## 2020-04-08 ENCOUNTER — Telehealth: Payer: Self-pay

## 2020-04-08 ENCOUNTER — Encounter: Payer: Medicare Other | Admitting: Surgical

## 2020-04-08 NOTE — Telephone Encounter (Signed)
Patient called to state she is having pain and describes a hard area where the drain was removed from. There are also skin changes in this area and she is concerned for infection. She has also been applying ice for the breast swelling with no improvement.

## 2020-04-12 ENCOUNTER — Other Ambulatory Visit: Payer: Self-pay

## 2020-04-12 ENCOUNTER — Encounter: Payer: Self-pay | Admitting: Surgical

## 2020-04-12 ENCOUNTER — Ambulatory Visit (INDEPENDENT_AMBULATORY_CARE_PROVIDER_SITE_OTHER): Payer: Medicare Other | Admitting: Surgical

## 2020-04-12 VITALS — HR 80 | Temp 99.1°F

## 2020-04-12 DIAGNOSIS — N62 Hypertrophy of breast: Secondary | ICD-10-CM

## 2020-04-12 NOTE — Progress Notes (Signed)
Patient is a 59 year old female here for follow-up after bilateral breast reduction with Dr. Ulice Bold on 03/23/2020  Patient reports that she had a lot of nausea after surgery, this has resolved. She reports that pain has improved, she is having most of her pain in her right axillary region. She is not having any infectious symptoms. She reports her bowels are moving normally.  Chaperone present on exam On exam bilateral NAC's are viable with good color, bilateral breast incisions intact. Honeycomb and Steri-Strip dressings in place. After removal of the honeycomb dressing, it appears all the incisions are intact. There is no bruising noted. Bilateral breasts are fairly symmetric with the right side being slightly more swollen. No fluid noted with palpation.  Recommend continue her compressive garment or sports bra 24/7 until 6 weeks postop. Recommend continue to avoid strenuous activities or any heavy lifting. Recommend leaving Steri-Strips in place for another week or so. Recommend removing these as she is showering, or we can remove these at her next follow-up. There is no sign of infection, seroma, hematoma.  Recommend following up in 2 weeks for reevaluation. Call with any questions or concerns.

## 2020-04-12 NOTE — Telephone Encounter (Signed)
Spoke with patient, she is coming in today for evaluation

## 2020-04-22 ENCOUNTER — Telehealth: Payer: Self-pay | Admitting: Surgical

## 2020-04-22 ENCOUNTER — Encounter: Payer: Medicare Other | Admitting: Surgical

## 2020-04-22 NOTE — Telephone Encounter (Signed)
Patient called to say that she is about one month post op (reduction)and her nipples appear to be peeling. She would like to know if she should be applying moisture or if there is anything she can put on them to help the peeling. Please call back to advise.

## 2020-04-22 NOTE — Telephone Encounter (Signed)
Returned patients call. Advised her she can apply Vaseline on both areola/nipples and cover with a 4x4.  Patient understood and agreed.

## 2020-04-25 ENCOUNTER — Encounter: Payer: Self-pay | Admitting: Surgical

## 2020-04-26 ENCOUNTER — Encounter: Payer: Self-pay | Admitting: Surgical

## 2020-04-26 ENCOUNTER — Other Ambulatory Visit: Payer: Self-pay

## 2020-04-26 ENCOUNTER — Ambulatory Visit (INDEPENDENT_AMBULATORY_CARE_PROVIDER_SITE_OTHER): Payer: Medicare Other | Admitting: Surgical

## 2020-04-26 VITALS — HR 70 | Temp 98.4°F

## 2020-04-26 DIAGNOSIS — M542 Cervicalgia: Secondary | ICD-10-CM

## 2020-04-26 DIAGNOSIS — G8929 Other chronic pain: Secondary | ICD-10-CM

## 2020-04-26 DIAGNOSIS — M549 Dorsalgia, unspecified: Secondary | ICD-10-CM

## 2020-04-26 DIAGNOSIS — N62 Hypertrophy of breast: Secondary | ICD-10-CM

## 2020-04-26 DIAGNOSIS — Z9889 Other specified postprocedural states: Secondary | ICD-10-CM

## 2020-04-26 NOTE — Progress Notes (Signed)
Patient is a 59 year old female here for follow-up after bilateral breast reduction with Dr. Ulice Bold on 03/23/2020.  She is approximately 5 weeks postop.  Patient reports that she is overall doing very well.  She is pleased with recovery thus far.  She does report that she has had some dryness of bilateral areolas.  She previously called the office and it was recommended she apply some Vaseline to these areas.  She reports that this has been helpful.  She has not had any infectious symptoms.  Chaperone present on exam On exam bilateral breast incisions intact.  Steri-Strips in place.  Bilateral NAC's are viable with good color.  She does have some dryness of bilateral NAC's.  No necrosis is noted.  No foul odors are noted.  No wounds are noted of either breast.  No erythema noted.  Bilateral breasts are fairly symmetric.   Recommend continue her compressive garment 24/7. Recommend avoiding strenuous activity until after 6 weeks postop. Discussed with patient I do not see anything concerning on exam, no sign of infection, seroma, hematoma. Recommend calling with any questions or concerns, recommend following up in 2 months for reevaluation. Pictures were obtained of the patient and placed in the chart with the patient's or guardian's permission.

## 2020-06-29 ENCOUNTER — Ambulatory Visit: Payer: Medicare Other | Admitting: Surgical

## 2020-07-18 ENCOUNTER — Ambulatory Visit (INDEPENDENT_AMBULATORY_CARE_PROVIDER_SITE_OTHER): Payer: Medicare Other | Admitting: Surgical

## 2020-07-18 ENCOUNTER — Encounter: Payer: Self-pay | Admitting: Surgical

## 2020-07-18 ENCOUNTER — Other Ambulatory Visit: Payer: Self-pay

## 2020-07-18 VITALS — BP 134/88 | HR 63

## 2020-07-18 DIAGNOSIS — Z9889 Other specified postprocedural states: Secondary | ICD-10-CM | POA: Diagnosis not present

## 2020-07-18 NOTE — Progress Notes (Signed)
   Referring Provider Darryl Lent, PA-C [address] [place],  [place] [address]   CC:  Chief Complaint  Patient presents with  . Follow-up      Renee Lane is an 60 y.o. female.  HPI: Patient is a 60 year old female here for follow-up after bilateral breast reduction with Dr. Ulice Bold on 03/23/2020.  She is approximately 4 months postop.  She reports that overall she is doing well, she reports some tenderness to the right breast, reports it is tender even with light touch.  She reports that she is overall very happy, reports significant improvement in her neck and back pain and feels as if she is able to do so many things that she could not do before.  She reports today that she is very pleased with her recovery and the overall care that she has received from our team.  Physical Exam Vitals with BMI 07/18/2020 04/26/2020 04/12/2020  Height - - -  Weight - - -  BMI - - -  Systolic 134 - -  Diastolic 88 - -  Pulse 63 70 80    General:  No acute distress,  Alert and oriented, Non-Toxic, Normal speech and affect Breasts: Bilateral NAC's are viable, bilateral breast incisions are intact.  No erythema or cellulitic changes are noted.  Fat necrosis is noted centrally of bilateral breasts.  Right side is more tender than left.  The fat necrosis is tender to palpation.  No fluid wave or fluid collections noted with palpation.  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 Ulice Bold.  Patient reports overall she is doing well, however reports that she is having a lot of tenderness of the right breast even with light touch.  She reports she has not noticed any other changes.  She is overall very pleased but would like to know if there is anything of concern causing the tenderness.  On exam I do palpate fat necrosis centrally of bilateral breasts,  this is tender to palpation.  There is no overlying skin changes.  I do not see any signs of infection.  I recommend light massage to the bilateral breast fat necrosis, she can do this daily.  I recommend she follow-up in 2 months for reevaluation to determine any improvement.  I did discuss with her that if she continues to have tenderness and the fat necrosis does not improve, surgical intervention is an option, however this would be a last resort.  Patient is understanding of this and will follow up in 2 months for reevaluation.  I recommend she call prior to 2 months with any questions or concerns.  Kermit Balo Scheeler 07/18/2020, 11:37 AM

## 2020-10-04 ENCOUNTER — Ambulatory Visit (INDEPENDENT_AMBULATORY_CARE_PROVIDER_SITE_OTHER): Payer: Medicare Other | Admitting: Plastic Surgery

## 2020-10-04 ENCOUNTER — Encounter: Payer: Self-pay | Admitting: Plastic Surgery

## 2020-10-04 ENCOUNTER — Other Ambulatory Visit: Payer: Self-pay

## 2020-10-04 DIAGNOSIS — N62 Hypertrophy of breast: Secondary | ICD-10-CM

## 2020-10-04 NOTE — Progress Notes (Signed)
The patient is a 60 year old female here for follow-up on her bilateral breast reduction.  She is doing really well and looks great.  No sign of infection.  Her scars have healed beautifully.  Continue with massage.  No adjustments needed at this time.  Follow-up as needed.  Pictures were obtained of the patient and placed in the chart with the patient's or guardian's permission.

## 2021-01-20 ENCOUNTER — Other Ambulatory Visit: Payer: Self-pay

## 2021-01-20 ENCOUNTER — Encounter: Payer: Self-pay | Admitting: Plastic Surgery

## 2021-01-20 ENCOUNTER — Ambulatory Visit (INDEPENDENT_AMBULATORY_CARE_PROVIDER_SITE_OTHER): Payer: Medicare Other | Admitting: Plastic Surgery

## 2021-01-20 DIAGNOSIS — H02833 Dermatochalasis of right eye, unspecified eyelid: Secondary | ICD-10-CM | POA: Insufficient documentation

## 2021-01-20 DIAGNOSIS — H02834 Dermatochalasis of left upper eyelid: Secondary | ICD-10-CM | POA: Diagnosis not present

## 2021-01-20 DIAGNOSIS — H02831 Dermatochalasis of right upper eyelid: Secondary | ICD-10-CM

## 2021-01-20 NOTE — Progress Notes (Signed)
Patient ID: Renee Lane, female    DOB: 1961/04/30, 60 y.o.   MRN: [id number]   Chief Complaint  Patient presents with   Advice Only    The patient is a 60 year old female here for evaluation of her eyelids.  The patient was seen in [place] by Dr. Terrial Rhodes and according to the patient had been approved for insurance to cover the procedure for an upper lid blepharoplasty.  She had visual field exams done which showed an improvement with the taping of her upper lid.  Approximately 2 weeks before the surgery Dr. Terrial Rhodes recommended a consultation with another physician who prescribed medication that the patient states she could not tolerate.  She had nausea and dizziness that was incompatible with daily activity.  The concern was for myasthenia gravis.  The patient then went for blood work which she states was negative for myasthenia gravis.  She still does not like the asymmetry and the droopiness that she feels she has in her upper lids.  There is a heaviness and it gets worse in the evenings.   Review of Systems  Constitutional: Negative.   HENT: Negative.    Eyes: Negative.   Respiratory: Negative.    Cardiovascular: Negative.   Gastrointestinal: Negative.   Endocrine: Negative.   Genitourinary: Negative.   Musculoskeletal: Negative.   Skin: Negative.   Hematological: Negative.   Psychiatric/Behavioral: Negative.     Past Medical History:  Diagnosis Date   Adhesive capsulitis of left shoulder    Anxiety    Chronic left shoulder pain    DDD (degenerative disc disease), lumbar    GERD (gastroesophageal reflux disease)    Tendinosis of left shoulder    Thoracic spine pain    Trochanteric bursitis of left hip     Past Surgical History:  Procedure Laterality Date   BREAST REDUCTION SURGERY Bilateral 03/23/2020   Procedure: BREAST REDUCTION WITH LIPOSUCTION;  Surgeon: Peggye Form, DO;  Location: [facility];  Service: Plastics;  Laterality:  Bilateral;  3 hours, please   c sections     CESAREAN SECTION     CHOLECYSTECTOMY     COLONOSCOPY     DILITATION & CURRETTAGE/HYSTROSCOPY WITH NOVASURE ABLATION N/A 02/05/2014   Procedure: DILATATION & CURETTAGE/HYSTEROSCOPY WITH NOVASURE ABLATION;  Surgeon: Antionette Char, MD;  Location: [facility];  Service: Gynecology;  Laterality: N/A;   FOOT SURGERY     IUD REMOVAL  07/2017   LUMBAR SPINE SURGERY     SHOULDER SURGERY Left 07/2017   for adhesive capsulitis; Dr. Scherrie Gerlach @ [facility]   TOTAL SHOULDER ARTHROPLASTY     TUBAL LIGATION        Current Outpatient Medications:    albuterol (VENTOLIN HFA) 108 (90 Base) MCG/ACT inhaler, Inhale into the lungs every 6 (six) hours as needed for wheezing or shortness of breath., Disp: , Rfl:    cetirizine (ZYRTEC) 10 MG tablet, Take by mouth as needed., Disp: , Rfl:    Cyanocobalamin (VITAMIN B 12 PO), Take 1 tablet by mouth daily., Disp: , Rfl:    dextromethorphan-guaiFENesin (MUCINEX DM) 30-600 MG 12hr tablet, Take 1 tablet by mouth 2 (two) times daily., Disp: , Rfl:    diclofenac sodium (VOLTAREN) 1 % GEL, Apply 3 gm to 3 large joints up to 3 times a day.Dispense 3 tubes with 3 refills., Disp: 3 Tube, Rfl: 0   HYDROcodone-acetaminophen (NORCO) 10-325 MG tablet, Take 1 tablet by mouth every 6 (six) hours as needed.,  Disp: , Rfl:    ondansetron (ZOFRAN) 4 MG tablet, Take 1 tablet (4 mg total) by mouth every 8 (eight) hours as needed for nausea or vomiting., Disp: 20 tablet, Rfl: 0   Objective:   Vitals:   01/20/21 1010  BP: 119/75  Pulse: 69  SpO2: 98%    Physical Exam Vitals and nursing note reviewed.  HENT:     Head: Normocephalic and atraumatic.  Cardiovascular:     Rate and Rhythm: Normal rate.     Pulses: Normal pulses.  Skin:    Capillary Refill: Capillary refill takes less than 2 seconds.     Coloration: Skin is not jaundiced.     Findings: No bruising or lesion.  Neurological:     General: No focal deficit  present.     Mental Status: She is alert.  Psychiatric:        Mood and Affect: Mood normal.        Behavior: Behavior normal.        Thought Content: Thought content normal.    Assessment & Plan:  Dermatochalasis of both upper eyelids With the multiple referrals and consultations with concerns about nerve injury I felt that it was in the patient's best interest to see a oculoplastic surgeon.  I have requested Dr. Dimas Millin to see the patient.  I would like to talk with the patient afterwards and see how things go.  Patient is in agreement and was very appreciative.  Alena Bills Akirah Storck, DO

## 2021-04-11 ENCOUNTER — Encounter: Payer: Self-pay | Admitting: Plastic Surgery

## 2021-04-11 ENCOUNTER — Ambulatory Visit (INDEPENDENT_AMBULATORY_CARE_PROVIDER_SITE_OTHER): Payer: Medicare Other | Admitting: Plastic Surgery

## 2021-04-11 ENCOUNTER — Other Ambulatory Visit: Payer: Self-pay

## 2021-04-11 VITALS — BP 126/82 | HR 74 | Ht 67.0 in | Wt 198.0 lb

## 2021-04-11 DIAGNOSIS — N62 Hypertrophy of breast: Secondary | ICD-10-CM

## 2021-04-11 NOTE — Progress Notes (Signed)
   Subjective:    Patient ID: Renee Lane, female    DOB: January 31, 1961, 60 y.o.   MRN: [id number]  The patient is a 60 year old female here for follow-up after undergoing bilateral breast reductions.  She had 932 grams removed from the right breast and 738 g removed from the left breast 03/2020.  She is very happy with her results.  She has a little tenderness with a seat belt but she uses a pillow and that helps.  No other issues.    All incisions well healed.       Review of Systems  Constitutional: Negative.   Eyes: Negative.   Respiratory: Negative.    Cardiovascular: Negative.   Gastrointestinal: Negative.   Endocrine: Negative.   Genitourinary: Negative.       Objective:   Physical Exam Constitutional:      Appearance: Normal appearance.  HENT:     Head: Normocephalic.  Cardiovascular:     Rate and Rhythm: Normal rate.     Pulses: Normal pulses.  Pulmonary:     Effort: Pulmonary effort is normal. No respiratory distress.  Skin:    General: Skin is warm.     Findings: No lesion.  Neurological:     Mental Status: She is alert and oriented to person, place, and time.  Psychiatric:        Mood and Affect: Mood normal.        Behavior: Behavior normal.        Thought Content: Thought content normal.       Assessment & Plan:     ICD-10-CM   1. Symptomatic mammary hypertrophy  N62       Pictures were obtained of the patient and placed in the chart with the patient's or guardian's permission.  Follow up as needed.  She still needs to talk with Dr. Dimas Millin about her eyes.  She will let me know the result after she sees him.

## 2021-12-12 ENCOUNTER — Ambulatory Visit (INDEPENDENT_AMBULATORY_CARE_PROVIDER_SITE_OTHER): Payer: Medicare Other | Admitting: Plastic Surgery

## 2021-12-12 ENCOUNTER — Encounter: Payer: Self-pay | Admitting: Plastic Surgery

## 2021-12-12 DIAGNOSIS — N6459 Other signs and symptoms in breast: Secondary | ICD-10-CM | POA: Diagnosis not present

## 2021-12-12 DIAGNOSIS — N62 Hypertrophy of breast: Secondary | ICD-10-CM

## 2021-12-12 NOTE — Progress Notes (Signed)
   Subjective:    Patient ID: Renee Lane, female    DOB: 11-Jan-1961, 61 y.o.   MRN: [id number]  The patient is a 61 year old female here for follow-up after undergoing breast reduction.  She had over 700 g removed from both breasts.  She had some tenderness with her seatbelt and had to use a pillow over the left breast.  She is here because she feels like she still has the tenderness.  She is tender in the right posterior rib area and then in the lateral aspect of the breast.  It feels like it is more musculoskeletal as I can pinpoint it.  It does not appear to be actually in the breast.  She had a mammogram this year and it was negative.     Review of Systems  Constitutional: Negative.   HENT: Negative.    Eyes: Negative.   Respiratory: Negative.    Cardiovascular: Negative.   Gastrointestinal: Negative.   Endocrine: Negative.   Genitourinary: Negative.   Musculoskeletal: Negative.        Objective:   Physical Exam Vitals and nursing note reviewed.  Constitutional:      Appearance: Normal appearance.  HENT:     Head: Normocephalic and atraumatic.  Cardiovascular:     Rate and Rhythm: Normal rate.     Pulses: Normal pulses.  Pulmonary:     Effort: Pulmonary effort is normal.  Abdominal:     Palpations: Abdomen is soft.  Skin:    General: Skin is warm.     Capillary Refill: Capillary refill takes less than 2 seconds.  Neurological:     Mental Status: She is alert and oriented to person, place, and time.  Psychiatric:        Mood and Affect: Mood normal.        Behavior: Behavior normal.        Thought Content: Thought content normal.        Judgment: Judgment normal.       Assessment & Plan:     ICD-10-CM   1. Symptomatic mammary hypertrophy  N62       The patient agrees that it could be musculoskeletal.  She is good to go for massage and if that helps great if not she will let me know and we will move forward with physical therapy.  And then if we have  any doubts we can certainly do an ultrasound right over the area that is tender to see if we can have any areas of concern.  Pictures were obtained of the patient and placed in the chart with the patient's or guardian's permission.

## 2022-01-05 ENCOUNTER — Telehealth: Payer: Self-pay | Admitting: Plastic Surgery

## 2022-01-05 DIAGNOSIS — Z9889 Other specified postprocedural states: Secondary | ICD-10-CM

## 2022-01-05 NOTE — Telephone Encounter (Signed)
Patient called the office earlier today reporting she was still having right breast pain.  I called the patient in regards to this.  Patient states that she is still having some right breast discomfort as she discussed with Dr. Ulice Bold at her last visit on 12/12/2021.  She states that the pain has not increased, but has stayed about the same.  Patient denies any redness over the area.  She denies any fevers or chills.  Patient states that she would like to pursue imaging as her and Dr. Ulice Bold talked about at her last visit.    Dr. Kittie Plater note from 12/12/2021 mentioned the possibility of physical therapy for the patient.  I brought this up with the patient on the phone, but she declines physical therapy at this time.  Order for ultrasound of the right breast will be placed.  Plan discussed with Dr. Ulice Bold.

## 2022-01-05 NOTE — Telephone Encounter (Signed)
Renee Lane is calling in stating that she is still having the discomfort in her R breast and would like to move forward to have the scan done on her breast.  Renee Lane stated that Dr. Ulice Bold stated to just call back and she will place a order for her to have it done.

## 2022-01-18 ENCOUNTER — Other Ambulatory Visit: Payer: Self-pay | Admitting: Student

## 2022-01-18 DIAGNOSIS — Z9889 Other specified postprocedural states: Secondary | ICD-10-CM

## 2022-02-28 ENCOUNTER — Other Ambulatory Visit: Payer: Self-pay | Admitting: Student

## 2022-02-28 ENCOUNTER — Ambulatory Visit: Payer: Medicare Other

## 2022-02-28 ENCOUNTER — Ambulatory Visit
Admission: RE | Admit: 2022-02-28 | Discharge: 2022-02-28 | Disposition: A | Discharge status: Home / Self Care | Payer: Medicare Other | Source: Ambulatory Visit | Attending: Student | Admitting: Student

## 2022-02-28 DIAGNOSIS — Z9889 Other specified postprocedural states: Secondary | ICD-10-CM

## 2022-03-02 ENCOUNTER — Telehealth: Payer: Self-pay | Admitting: Student

## 2022-03-02 NOTE — Telephone Encounter (Signed)
I called the patient with the results of her mammogram to the right breast.  The results showed no evidence of malignancy.  The impression mentioned that the pain may be due to post reduction changes or from fat necrosis.  I discussed this with the patient.  We will have her follow-up with Dr. Marla Roe via telephone visit soon.  Patient was in agreement with the plan.

## 2022-03-06 ENCOUNTER — Ambulatory Visit (INDEPENDENT_AMBULATORY_CARE_PROVIDER_SITE_OTHER): Payer: Medicare Other | Admitting: Plastic Surgery

## 2022-03-06 ENCOUNTER — Encounter: Payer: Self-pay | Admitting: Plastic Surgery

## 2022-03-06 DIAGNOSIS — N62 Hypertrophy of breast: Secondary | ICD-10-CM

## 2022-03-06 NOTE — Progress Notes (Signed)
   Subjective:    Patient ID: Renee Lane, female    DOB: Jun 04, 1960, 62 y.o.   MRN: [id number]  The patient is a 61 year old female joining me by phone for discussion about her breasts.  She underwent a breast reduction in November 2021.  She describes an uncomfortable feeling in 1 area of her breast.  She was concerned that there was something wrong.  We did a mammogram on October 25 to be sure there were no issues.  The mammogram was negative.  The patient had 700 cc removed from both breasts.  She is got good symmetry.  She was relieved that nothing was seen on the mammogram so she is overall feeling better about the situation.      Review of Systems  Constitutional: Negative.   HENT: Negative.    Eyes: Negative.   Respiratory: Negative.    Cardiovascular: Negative.   Gastrointestinal: Negative.   Endocrine: Negative.   Genitourinary: Negative.        Objective:   Physical Exam      Assessment & Plan:     ICD-10-CM   1. Symptomatic mammary hypertrophy  N62        I connected with  Renee Lane on 03/06/22 by phone and verified that I am speaking with the correct person using two identifiers. The patient was at home and I was at the office.  We spent 10 minutes in discussion.  I would like the patient to massage the particular area of discomfort to see if she can break up the scar tissue.  Then if she would come and see me we can do a Kenalog injection to the area to see if we can loosen up the scar tissue and see if that will help with this discomfort.  The patient is pleased with this plan and will notify us in the next few months for follow-up visit.   I discussed the limitations of evaluation and management by telemedicine. The patient expressed understanding and agreed to proceed.
